# Patient Record
Sex: Male | Born: 1975 | Race: White | Hispanic: No | Marital: Married
Health system: Midwestern US, Community
[De-identification: ages and names within clinical notes are randomized; demographics above are authoritative.]

## PROBLEM LIST (undated history)

## (undated) DIAGNOSIS — M1732 Unilateral post-traumatic osteoarthritis, left knee: Secondary | ICD-10-CM

## (undated) DIAGNOSIS — Z96652 Presence of left artificial knee joint: Secondary | ICD-10-CM

## (undated) DIAGNOSIS — M1712 Unilateral primary osteoarthritis, left knee: Secondary | ICD-10-CM

## (undated) DIAGNOSIS — R739 Hyperglycemia, unspecified: Secondary | ICD-10-CM

## (undated) DIAGNOSIS — R5383 Other fatigue: Secondary | ICD-10-CM

## (undated) DIAGNOSIS — M25562 Pain in left knee: Secondary | ICD-10-CM

## (undated) DIAGNOSIS — E039 Hypothyroidism, unspecified: Secondary | ICD-10-CM

## (undated) DIAGNOSIS — Z09 Encounter for follow-up examination after completed treatment for conditions other than malignant neoplasm: Secondary | ICD-10-CM

## (undated) DIAGNOSIS — T1490XA Injury, unspecified, initial encounter: Secondary | ICD-10-CM

## (undated) DIAGNOSIS — G8929 Other chronic pain: Secondary | ICD-10-CM

---

## 2014-02-15 ENCOUNTER — Encounter: Admit: 2014-02-15

## 2014-02-15 ENCOUNTER — Ambulatory Visit: Admit: 2014-02-15

## 2014-03-03 ENCOUNTER — Inpatient Hospital Stay: Admit: 2014-03-03

## 2014-05-03 ENCOUNTER — Encounter

## 2014-06-21 ENCOUNTER — Encounter

## 2014-08-09 ENCOUNTER — Encounter

## 2016-10-02 ENCOUNTER — Encounter: Attending: Family Medicine | Primary: Family Medicine

## 2016-10-02 ENCOUNTER — Ambulatory Visit
Admit: 2016-10-02 | Discharge: 2016-10-02 | Payer: PRIVATE HEALTH INSURANCE | Attending: Family Medicine | Primary: Family Medicine

## 2016-10-02 DIAGNOSIS — Z0000 Encounter for general adult medical examination without abnormal findings: Secondary | ICD-10-CM

## 2016-10-02 MED ORDER — HYDROCODONE-ACETAMINOPHEN 5-325 MG PO TABS
5-325 MG | ORAL_TABLET | Freq: Four times a day (QID) | ORAL | 0 refills | Status: AC | PRN
Start: 2016-10-02 — End: 2016-10-09

## 2016-10-02 MED ORDER — DICLOFENAC SODIUM 50 MG PO TBEC
50 MG | ORAL_TABLET | Freq: Three times a day (TID) | ORAL | 3 refills | Status: DC
Start: 2016-10-02 — End: 2016-12-17

## 2016-10-02 NOTE — Assessment & Plan Note (Signed)
Labs as ordered

## 2016-10-02 NOTE — Assessment & Plan Note (Signed)
X 2 months, getting worse-get left knee xray, refer to Dr. Thana AtesNitz, diclofenac as needed, norco prn severe pain-reviewed risks/benefits/SE. Pt voices understanding

## 2016-10-02 NOTE — Patient Instructions (Addendum)
Patient Education        Joint Pain: Care Instructions  Your Care Instructions    Many people have small aches and pains from overuse or injury to muscles and joints. Joint injuries often happen during sports or recreation, work tasks, or projects around the home. An overuse injury can happen when you put too much stress on a joint or when you do an activity that stresses the joint over and over, such as using the computer or rowing a boat.  You can take action at home to help your muscles and joints get better. You should feel better in 1 to 2 weeks, but it can take 3 months or more to heal completely.  Follow-up care is a key part of your treatment and safety. Be sure to make and go to all appointments, and call your doctor if you are having problems. It's also a good idea to know your test results and keep a list of the medicines you take.  How can you care for yourself at home?   Do not put weight on the injured joint for at least a day or two.   For the first day or two after an injury, do not take hot showers or baths, and do not use hot packs. The heat could make swelling worse.   Put ice or a cold pack on the sore joint for 10 to 20 minutes at a time. Try to do this every 1 to 2 hours for the next 3 days (when you are awake) or until the swelling goes down. Put a thin cloth between the ice and your skin.   Wrap the injury in an elastic bandage. Do not wrap it too tightly because this can cause more swelling.   Prop up the sore joint on a pillow when you ice it or anytime you sit or lie down during the next 3 days. Try to keep it above the level of your heart. This will help reduce swelling.   Take an over-the-counter pain medicine, such as acetaminophen (Tylenol), ibuprofen (Advil, Motrin), or naproxen (Aleve). Read and follow all instructions on the label.   After 1 or 2 days of rest, begin moving the joint gently. While the joint is still healing, you can begin to exercise using activities that do  not strain or hurt the painful joint.  When should you call for help?  Call your doctor now or seek immediate medical care if:    You have signs of infection, such as:   Increased pain, swelling, warmth, and redness.   Red streaks leading from the joint.   A fever.   Watch closely for changes in your health, and be sure to contact your doctor if:    Your movement or symptoms are not getting better after 1 to 2 weeks of home treatment.   Where can you learn more?  Go to [email or web address] and sign in to your MyChart account. Enter P205 in the Search Health Information box to learn more about "Joint Pain: Care Instructions."     If you do not have an account, please click on the "Sign Up Now" link.  Current as of: March 07, 2016  Content Version: 11.6   2006-2018 [employer]. Care instructions adapted under license by [facility]. If you have questions about a medical condition or this instruction, always ask your healthcare professional. [employer] disclaims any warranty or liability for your use of this information.

## 2016-10-02 NOTE — Progress Notes (Signed)
Scott Jacobs  Apr 08, 1976  41 y.o.  male    SUBJECTIVE:    Chief Complaint   Patient presents with   . New Patient       HPI   Left knee pain-onset left knee pain/swelling approx 2 months ago. Pt was getting in truck and felt pop in knee-muscles "gave out". Continues to swell, throbs, nothing helps even when taking advil all day. Worse with wt bearing/bending. Pain mostly inner knee and below joint line. Pt has seen Dr. Thana Ates in past and would like to be referred back to him    Pt also here to establish care -chronic issues include hypothyroidism.  No current outpatient prescriptions on file prior to visit.     No current facility-administered medications on file prior to visit.      Review of PMH, PSH, Family Hx, Allergies and updates made as needed.      Allergies   Allergen Reactions   . Azithromycin Hives and Itching   . Clindamycin Hives and Itching       Past Medical History:   Diagnosis Date   . Hypothyroidism    . Knee pain        Past Surgical History:   Procedure Laterality Date   . ANTERIOR CRUCIATE LIGAMENT REPAIR Left     X2   . NASAL SEPTUM SURGERY     . TONSILLECTOMY  2017       Social History     Social History   . Marital status: Married     Spouse name: N/A   . Number of children: N/A   . Years of education: N/A     Social History Main Topics   . Smoking status: Former Smoker     Packs/day: 0.50     Years: 8.00     Types: Cigarettes     Quit date: 2007   . Smokeless tobacco: Former Neurosurgeon     Types: Snuff     Quit date: 71   . Alcohol use Yes      Comment: RARELY   . Drug use: No   . Sexual activity: Yes     Partners: Female     Other Topics Concern   . None     Social History Narrative   . None       Review of Systems   Constitutional: Negative for activity change, appetite change, chills, fatigue, fever and unexpected weight change.   HENT: Negative for dental problem, hearing loss and trouble swallowing.    Eyes: Negative for visual disturbance.   Respiratory: Negative for cough, chest  tightness and shortness of breath.    Cardiovascular: Negative for chest pain.   Gastrointestinal: Negative for abdominal pain, diarrhea, nausea and vomiting.   Endocrine: Negative for cold intolerance, heat intolerance, polydipsia and polyuria.   Genitourinary: Negative for dysuria, frequency and hematuria.   Musculoskeletal: Positive for arthralgias, gait problem and joint swelling. Negative for myalgias.   Skin: Negative for color change and rash.   Allergic/Immunologic: Negative for immunocompromised state.   Neurological: Negative for dizziness, weakness and headaches.   Hematological: Negative for adenopathy. Does not bruise/bleed easily.   Psychiatric/Behavioral: Negative for dysphoric mood. The patient is not nervous/anxious.        OBJECTIVE:    BP 136/84   Pulse 99   Resp 16   Ht 6' 3.75" (1.924 m)   Wt (!) 361 lb 3.2 oz (163.8 kg)   BMI 44.26 kg/m  Physical Exam   Constitutional: He is oriented to person, place, and time. He appears well-developed and well-nourished. He is cooperative. No distress.   HENT:   Head: Normocephalic.   Right Ear: Tympanic membrane, external ear and ear canal normal.   Left Ear: Tympanic membrane, external ear and ear canal normal.   Nose: Nose normal.   Mouth/Throat: Uvula is midline, oropharynx is clear and moist and mucous membranes are normal.   Neck: Trachea normal and normal range of motion. Neck supple. No thyromegaly present.   Cardiovascular: Normal rate, regular rhythm and intact distal pulses.    Pulmonary/Chest: Effort normal and breath sounds normal.   Abdominal: Soft. Normal appearance and bowel sounds are normal. There is no hepatosplenomegaly. There is no tenderness.   Musculoskeletal: Normal range of motion.        Left knee: He exhibits swelling. Tenderness found. Medial joint line tenderness noted.   Lymphadenopathy:     He has no cervical adenopathy.   Neurological: He is alert and oriented to person, place, and time. He has normal strength. No  cranial nerve deficit or sensory deficit.   Skin: Skin is warm, dry and intact. No rash noted.   Psychiatric: He has a normal mood and affect. His speech is normal and behavior is normal. Judgment and thought content normal. Cognition and memory are normal.       ASSESSMENT/PLAN:    Problem List        Endocrine    Acquired hypothyroidism     Labs as ordered         Relevant Medications    levothyroxine (SYNTHROID) 200 MCG tablet    Other Relevant Orders    TSH WITH REFLEX TO FT4       Other    Acute pain of left knee     X 2 months, getting worse-get left knee xray, refer to Dr. Thana AtesNitz, diclofenac as needed, norco prn severe pain-reviewed risks/benefits/SE. Pt voices understanding         Relevant Medications    diclofenac (VOLTAREN) 50 MG EC tablet    HYDROcodone-acetaminophen (NORCO) 5-325 MG per tablet    Other Relevant Orders    XR KNEE LEFT (1-2 VIEWS)    External Referral To Orthopedic Surgery    Well adult exam - Primary    Relevant Orders    Comprehensive Metabolic Panel    Screening for hyperlipidemia    Relevant Orders    Lipid Panel               Return if symptoms worsen or fail to improve.

## 2016-11-08 MED ORDER — LEVOTHYROXINE SODIUM 200 MCG PO TABS
200 | ORAL_TABLET | Freq: Every day | ORAL | 3 refills | Status: DC
Start: 2016-11-08 — End: 2017-11-10

## 2016-12-17 MED ORDER — MELOXICAM 15 MG PO TABS
15 MG | ORAL_TABLET | Freq: Every day | ORAL | 3 refills | Status: DC
Start: 2016-12-17 — End: 2017-02-05

## 2016-12-17 NOTE — Telephone Encounter (Signed)
Let Amy know the order for the left knee xray is still good and can be done at the imaging center on [address] [facility]. He can go ahead and do that if he'd like. I thought he was going to see ortho-did he see someone? Does he want to see someone? He may need injections for pain relief. I can send in something else for pain, have him stop the diclofenac, but if he needs anything stronger then he would need to have an office visit.

## 2016-12-17 NOTE — Telephone Encounter (Signed)
I advised Scott Jacobs that Mobic was sent to the pharmacy for [name] and to stop the diclofenac. Scott Jacobs stated she would get pt scheduled to get the x-ray at the imaging center in [place] soon. She states pt has not seen Ortho yet, he has an open referral to Dr. [place]itz in [place] and states she thought they were waiting on the x-ray before they scheduled an appt with Dr. Thana AtesNitz.

## 2016-12-17 NOTE — Telephone Encounter (Signed)
I received a VM on MA line from Amy, pt's wife stating that pt has not gone to get x-ray for knee yet, he wasn't sure where to go to get that. Amy states that Scott Jacobs has had a lot of knee pain and wanted to know if any medication could be prescribed for hat or if he needed another appt to be seen. Please advise.

## 2016-12-20 ENCOUNTER — Ambulatory Visit: Admit: 2016-12-20 | Primary: Family Medicine

## 2016-12-20 ENCOUNTER — Inpatient Hospital Stay: Attending: Family Medicine | Primary: Family Medicine

## 2016-12-20 DIAGNOSIS — M25562 Pain in left knee: Secondary | ICD-10-CM

## 2017-02-05 ENCOUNTER — Ambulatory Visit
Admit: 2017-02-05 | Discharge: 2017-02-05 | Payer: PRIVATE HEALTH INSURANCE | Attending: Family Medicine | Primary: Family Medicine

## 2017-02-05 DIAGNOSIS — M25562 Pain in left knee: Secondary | ICD-10-CM

## 2017-02-05 MED ORDER — HYDROCODONE-ACETAMINOPHEN 5-325 MG PO TABS
5-325 MG | ORAL_TABLET | Freq: Four times a day (QID) | ORAL | 0 refills | Status: AC | PRN
Start: 2017-02-05 — End: 2017-02-12

## 2017-02-05 MED ORDER — MELOXICAM 15 MG PO TABS
15 | ORAL_TABLET | Freq: Every day | ORAL | 3 refills | Status: DC
Start: 2017-02-05 — End: 2017-05-16

## 2017-02-05 NOTE — Progress Notes (Signed)
Scott Jacobs  12-20-1975  41 y.o.  male    SUBJECTIVE:    Chief Complaint   Patient presents with   . Discuss Medications   . Flu Vaccine     pt declines flu shot today       HPI   Chronic left knee pain-x several months, has had xray but not seen ortho yet. meloxicam helps greatly. 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 Working on diet/trying to exercise for wt loss to see it this will help with pain also.    Current Outpatient Prescriptions on File Prior to Visit   Medication Sig Dispense Refill   . levothyroxine (SYNTHROID) 200 MCG tablet Take 1 tablet by mouth Daily 90 tablet 3     No current facility-administered medications on file prior to visit.      Review of PMH, PSH, Family Hx, Allergies and updates made as needed.      Allergies   Allergen Reactions   . Azithromycin Hives and Itching   . Clindamycin Hives and Itching       Past Medical History:   Diagnosis Date   . Hypothyroidism    . Knee pain        Past Surgical History:   Procedure Laterality Date   . ANTERIOR CRUCIATE LIGAMENT REPAIR Left     X2   . NASAL SEPTUM SURGERY     . TONSILLECTOMY  2017       Social History     Social History   . Marital status: Married     Spouse name: N/A   . Number of children: N/A   . Years of education: N/A     Social History Main Topics   . Smoking status: Former Smoker     Packs/day: 0.50     Years: 8.00     Types: Cigarettes     Quit date: 2007   . Smokeless tobacco: Former Neurosurgeon     Types: Snuff     Quit date: 73   . Alcohol use Yes      Comment: RARELY   . Drug use: No   . Sexual activity: Yes     Partners: Female     Other Topics Concern   . None     Social History Narrative   . None       Review of Systems   Constitutional: Negative for fatigue.   Respiratory: Negative for cough and shortness of breath.    Cardiovascular: Negative for chest pain and leg swelling.   Endocrine: Negative.    Musculoskeletal: Positive for  arthralgias.   Skin: Negative for rash.   Neurological: Negative for dizziness and headaches.       OBJECTIVE:    BP 138/88 (Site: Left Upper Arm, Position: Sitting, Cuff Size: Large Adult)   Pulse 91   Resp 20   Ht 6\' 1"  (1.854 m)   Wt (!) 358 lb 12.8 oz (162.8 kg)   BMI 47.34 kg/m     Physical Exam   Constitutional: He is oriented to person, place, and time. He appears well-developed and well-nourished. No distress.   HENT:   Head: Normocephalic.   Right Ear: External ear normal.   Left Ear: External ear normal.   Nose: Nose normal.   Neck: Neck supple. No thyromegaly present.   Cardiovascular: Normal rate, regular rhythm and normal heart sounds.    Pulmonary/Chest:  Effort normal and breath sounds normal.   Musculoskeletal:        Left knee: Tenderness found.   Neurological: He is alert and oriented to person, place, and time.   Skin: Skin is warm.       ASSESSMENT/PLAN:    Problem List        Other    Chronic pain of left knee - Primary     Doing well with mobic, reviewed xray results with pt, pt would like to wait until after shutdown in December to see ortho  Refill norco-pt using only for severe pain, usually 1-2 times a month-reviewed risks/benefits/Se with pt-voices understanding         Relevant Medications    meloxicam (MOBIC) 15 MG tablet    HYDROcodone-acetaminophen (NORCO) 5-325 MG per tablet    Class 3 severe obesity without serious comorbidity with body mass index (BMI) of 45.0 to 49.9 in adult [facility])     Will continue to work on healthy diet choices, increase exercise               Attestation: The Prescription Monitoring Report for this patient was reviewed today. Alycia Patten(Janina Trafton, PA-C)  Documentation: No signs of potential drug abuse or diversion identified., Obtaining appropriate analgesic effect of treatment., Possible medication side effects, risk of tolerance/dependence & alternative treatments discussed. Alycia Patten(Dailey Alberson, PA-C)    Return if symptoms worsen or fail to improve.

## 2017-02-05 NOTE — Assessment & Plan Note (Signed)
Doing well with mobic, reviewed xray results with pt, pt would like to wait until after shutdown in December to see ortho  Refill norco-pt using only for severe pain, usually 1-2 times a month-reviewed risks/benefits/Se with pt-voices understanding

## 2017-02-05 NOTE — Assessment & Plan Note (Signed)
Will continue to work on [employer] diet choices, increase exercise

## 2017-02-14 NOTE — Telephone Encounter (Signed)
Cld & left vm advising pt [facility] will not release lab results without a release of auth form signed by him. I advised him to call [facility] and get the results either mailed to him or our office. And to provide us with a copy and we can scan into his record.

## 2017-05-16 ENCOUNTER — Encounter

## 2017-05-16 ENCOUNTER — Ambulatory Visit
Admit: 2017-05-16 | Discharge: 2017-05-16 | Payer: PRIVATE HEALTH INSURANCE | Attending: Family Medicine | Primary: Family Medicine

## 2017-05-16 DIAGNOSIS — M25562 Pain in left knee: Secondary | ICD-10-CM

## 2017-05-16 MED ORDER — HYDROCODONE-ACETAMINOPHEN 5-325 MG PO TABS
5-325 MG | ORAL_TABLET | Freq: Four times a day (QID) | ORAL | 0 refills | Status: AC | PRN
Start: 2017-05-16 — End: 2017-05-23

## 2017-05-16 NOTE — Assessment & Plan Note (Signed)
Lab order as directed

## 2017-05-16 NOTE — Assessment & Plan Note (Signed)
Pt has lost 20 # in last 6-8 weeks with calorie restriction, eating more fresh fruits/veggies/healthier food choices  Will work on increasing activity levels

## 2017-05-16 NOTE — Progress Notes (Signed)
Scott Jacobs  06/22/1975  42 y.o.  male    SUBJECTIVE:    Chief Complaint   Patient presents with   . Knee Pain     left knee ongoing pain; not getting better       HPI  Left knee pain-chronic, ongoing. Worse with wt bearing/end of long day of standing at work. Has seen ortho in past for same but it has been years ago. Has used multiple OTC meds with little relief, norco does help at times. Pt working on wt loss-has restricted calories to 1700-1900 and has lost 20# in last 6-8 weeks. Has not noticed yet that the weight loss is helping his knee pain.    Hypothyroidism-chronic, since losing weight in last two months he is feeling more jittery. Feels similar to past when medication was too high.    Screening labs-needs for wellness visit coming up, would like to do ahead of time to review.     Current Outpatient Prescriptions on File Prior to Visit   Medication Sig Dispense Refill   . levothyroxine (SYNTHROID) 200 MCG tablet Take 1 tablet by mouth Daily 90 tablet 3     No current facility-administered medications on file prior to visit.      Review of PMH, PSH, Family Hx, Allergies and updates made as needed.    PHQ-9 Total Score: 0 (05/16/2017  3:54 PM)      Allergies   Allergen Reactions   . Azithromycin Hives and Itching   . Clindamycin Hives and Itching       Past Medical History:   Diagnosis Date   . Hypothyroidism    . Knee pain        Past Surgical History:   Procedure Laterality Date   . ANTERIOR CRUCIATE LIGAMENT REPAIR Left     X2   . NASAL SEPTUM SURGERY     . TONSILLECTOMY  2017       Social History     Social History   . Marital status: Married     Spouse name: N/A   . Number of children: N/A   . Years of education: N/A     Social History Main Topics   . Smoking status: Former Smoker     Packs/day: 0.50     Years: 8.00     Types: Cigarettes     Quit date: 2007   . Smokeless tobacco: Former NeurosurgeonUser     Types: Snuff     Quit date: 411997   . Alcohol use Yes      Comment: RARELY   . Drug use: No   . Sexual  activity: Yes     Partners: Female     Other Topics Concern   . None     Social History Narrative   . None       Review of Systems   Constitutional: Negative for activity change, appetite change, fatigue and unexpected weight change.   Respiratory: Negative for cough and shortness of breath.    Cardiovascular: Negative for chest pain and leg swelling.   Endocrine: Negative.    Musculoskeletal: Positive for arthralgias and joint swelling.   Skin: Negative for rash.   Neurological: Negative for dizziness, light-headedness and headaches.   Hematological: Negative for adenopathy.       OBJECTIVE:    BP 124/78 (Site: Left Upper Arm, Position: Sitting, Cuff Size: Large Adult)   Pulse 86   Resp 18   Ht 6\' 1"  (1.854 m)  Wt (!) 345 lb 6.4 oz (156.7 kg)   BMI 45.57 kg/m     Physical Exam   Constitutional: He is oriented to person, place, and time. He appears well-developed and well-nourished. No distress.   Neck: Normal range of motion. Neck supple. No thyromegaly present.   Cardiovascular: Normal rate, regular rhythm and normal heart sounds.    Pulmonary/Chest: Effort normal.   Musculoskeletal:        Left knee: He exhibits effusion and bony tenderness. Tenderness found. Medial joint line and lateral joint line tenderness noted.   Neurological: He is alert and oriented to person, place, and time.       ASSESSMENT/PLAN:    Problem List        Endocrine    Acquired hypothyroidism     Labs ordered today         Relevant Medications    levothyroxine (SYNTHROID) 200 MCG tablet    Other Relevant Orders    TSH WITH REFLEX TO FT4    Comprehensive Metabolic Panel       Other    Chronic pain of left knee - Primary     Will get MRI now, refill norco to use prn-Risks/benefits/SE reviewed, pt voices understanding  Refer to ortho once MRI completed         Relevant Medications    HYDROcodone-acetaminophen (NORCO) 5-325 MG per tablet    Other Relevant Orders    MRI KNEE LEFT W CONTRAST    Class 3 severe obesity without serious  comorbidity with body mass index (BMI) of 45.0 to 49.9 in adult (HCC)     Pt has lost 20 # in last 6-8 weeks with calorie restriction, eating more fresh fruits/veggies/healthier food choices  Will work on increasing activity levels         Screening for cholesterol level     Lab order as directed         Relevant Orders    Comprehensive Metabolic Panel    Lipid, Fasting               Return if symptoms worsen or fail to improve.

## 2017-05-16 NOTE — Assessment & Plan Note (Signed)
Labs ordered today

## 2017-05-16 NOTE — Assessment & Plan Note (Signed)
Will get MRI now, refill norco to use prn-Risks/benefits/SE reviewed, pt voices understanding  Refer to ortho once MRI completed

## 2017-05-17 LAB — COMPREHENSIVE METABOLIC PANEL
ALT: 42 U/L — ABNORMAL HIGH (ref 10–40)
AST: 29 U/L (ref 15–37)
Albumin/Globulin Ratio: 1.8 (ref 1.1–2.2)
Albumin: 4.9 g/dL (ref 3.4–5.0)
Alkaline Phosphatase: 110 U/L (ref 40–129)
Anion Gap: 15 (ref 3–16)
BUN: 13 mg/dL (ref 7–20)
CO2: 25 mmol/L (ref 21–32)
Calcium: 9.6 mg/dL (ref 8.3–10.6)
Chloride: 100 mmol/L (ref 99–110)
Creatinine: 0.8 mg/dL — ABNORMAL LOW (ref 0.9–1.3)
GFR African American: >60 (ref >60)
GFR Non-African American: >60 (ref >60)
Globulin: 2.8 g/dL
Glucose: 86 mg/dL (ref 70–99)
Potassium: 3.8 mmol/L (ref 3.5–5.1)
Sodium: 140 mmol/L (ref 136–145)
Total Bilirubin: 0.5 mg/dL (ref 0.0–1.0)
Total Protein: 7.7 g/dL (ref 6.4–8.2)

## 2017-05-17 LAB — TSH WITH REFLEX TO FT4: TSH Reflex FT4: 1.46 u[IU]/mL (ref 0.27–4.20)

## 2017-05-17 LAB — LIPID, FASTING
Cholesterol, Fasting: 154 mg/dL (ref 0–199)
HDL: 36 mg/dL — ABNORMAL LOW (ref 40–60)
LDL Calculated: 84 mg/dL (ref <100)
Triglyceride, Fasting: 171 mg/dL — ABNORMAL HIGH (ref 0–150)
VLDL Cholesterol Calculated: 34 mg/dL

## 2017-05-22 ENCOUNTER — Telehealth

## 2017-05-22 NOTE — Telephone Encounter (Signed)
Scott Jacobs cld and left vm stating that Per their Radiologist, MRI are done Without Contrast.  [facility] K[facility] With Contrast, They would like the order redone to state MRI LT Knee Without Contrast.

## 2017-05-22 NOTE — Telephone Encounter (Signed)
Order changed and put in

## 2017-05-25 ENCOUNTER — Inpatient Hospital Stay: Admit: 2017-05-25 | Payer: PRIVATE HEALTH INSURANCE | Primary: Family Medicine

## 2017-05-25 DIAGNOSIS — M25562 Pain in left knee: Secondary | ICD-10-CM

## 2017-07-24 NOTE — Telephone Encounter (Signed)
Pt's wife Amy called, stating MRI of knee was not covered by insurance, and that a PA should have been done prior.     Insurance told her that pt should have had XR and had RX for NSAIDS, prior to MRI.     XR of knee, Meloxicam Rx and OV from 10/02/16 (discussing knee and Rx), faxed to [facility] at [phone].Pending Auth [id number]. Confirmed fax scanned to chart.     No further action is needed, at this time.

## 2017-08-27 ENCOUNTER — Ambulatory Visit
Admit: 2017-08-27 | Discharge: 2017-08-27 | Payer: PRIVATE HEALTH INSURANCE | Attending: Family Medicine | Primary: Family Medicine

## 2017-08-27 DIAGNOSIS — M25562 Pain in left knee: Secondary | ICD-10-CM

## 2017-08-27 MED ORDER — MELOXICAM 15 MG PO TABS
15 MG | ORAL_TABLET | Freq: Every day | ORAL | 1 refills | Status: DC | PRN
Start: 2017-08-27 — End: 2017-12-05

## 2017-08-27 MED ORDER — HYDROCODONE-ACETAMINOPHEN 5-325 MG PO TABS
5-325 MG | ORAL_TABLET | Freq: Four times a day (QID) | ORAL | 0 refills | Status: AC | PRN
Start: 2017-08-27 — End: 2017-09-26

## 2017-08-27 NOTE — Progress Notes (Signed)
Scott Jacobs  08-13-1975  42 y.o.  male    SUBJECTIVE:    Chief Complaint   Patient presents with   ??? Knee Pain     still having issues with left knee       HPI   Chronic left knee pain-has had ACL repair in past, in past year pt has had increasing pain-worse with standing/wt bearing at times. 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 Pt had been using mobic with moderate relief. Norco prn severe pain with good results. Had MRI 05/25/17:    . Status post ACL reconstruction with complete rupture of the graft.   2. Complex tearing/maceration of the posterior horn of the medial meniscus to   the extent the meniscus is visualized on the current exam.   3. Free edge degeneration of the lateral meniscus without definite discrete   tear to the extent the meniscus is visualized on the current exam.   4. Mild-to-moderate tricompartmental osteoarthritis with underlying grade 4   chondromalacia involving all 3 compartments of the knee.   5. Small knee effusion and synovitis.   6. Small popliteal cyst.   7. ??Heterogeneous appearance of the bone marrow which is a nonspecific   finding and can be seen in obese patient, smokers, and in the setting of   anemia amongst other etiologies. ??Clinical and laboratory correlation   suggested.     Pt will be seeing previous ortho but is having trouble getting in with his current work hours.    Current Outpatient Medications on File Prior to Visit   Medication Sig Dispense Refill   ??? levothyroxine (SYNTHROID) 200 MCG tablet Take 1 tablet by mouth Daily 90 tablet 3     No current facility-administered medications on file prior to visit.      Review of PMH, PSH, Family Hx, Allergies and updates made as needed.    /PHQ-9 Total Score: 0 (08/27/2017  4:11 PM)      Allergies   Allergen Reactions   ??? Azithromycin Hives and Itching   ??? Clindamycin Hives and Itching       Past Medical History:   Diagnosis Date   ??? Hypothyroidism    ??? Knee pain        Past Surgical History:    Procedure Laterality Date   ??? ANTERIOR CRUCIATE LIGAMENT REPAIR Left     X2   ??? NASAL SEPTUM SURGERY     ??? TONSILLECTOMY  2017       Social History     Socioeconomic History   ??? Marital status: Married     Spouse name: None   ??? Number of children: None   ??? Years of education: None   ??? Highest education level: None   Occupational History   ??? None   Social Needs   ??? Financial resource strain: None   ??? Food insecurity:     Worry: None     Inability: None   ??? Transportation needs:     Medical: None     Non-medical: None   Tobacco Use   ??? Smoking status: Former Smoker     Packs/day: 0.50     Years: 8.00     Pack years: 4.00     Types: Cigarettes     Last attempt to quit: 2007     Years since quitting: 12.3   ??? Smokeless tobacco: Former Neurosurgeon     Types: Snuff  Quit date: 1997   Substance and Sexual Activity   ??? Alcohol use: Yes     Comment: RARELY   ??? Drug use: No   ??? Sexual activity: Yes     Partners: Female   Lifestyle   ??? Physical activity:     Days per week: None     Minutes per session: None   ??? Stress: None   Relationships   ??? Social connections:     Talks on phone: None     Gets together: None     Attends religious service: None     Active member of club or organization: None     Attends meetings of clubs or organizations: None     Relationship status: None   ??? Intimate partner violence:     Fear of current or ex partner: None     Emotionally abused: None     Physically abused: None     Forced sexual activity: None   Other Topics Concern   ??? None   Social History Narrative   ??? None       Review of Systems   Constitutional: Negative for chills, fever and unexpected weight change.   Respiratory: Negative for cough and shortness of breath.    Cardiovascular: Negative for chest pain and leg swelling.   Musculoskeletal: Positive for arthralgias.   Skin: Negative for color change and rash.   Neurological: Negative for light-headedness and headaches.       OBJECTIVE:    BP 136/86 (Site: Left Upper Arm, Position:  Sitting, Cuff Size: Large Adult)    Pulse 65    Resp 18    Ht  (1.854 m)    Wt (!) 329 lb 9.6 oz (149.5 kg)    BMI 43.49 kg/m??     Physical Exam   Constitutional: He is oriented to person, place, and time. He appears well-developed and well-nourished. No distress.   HENT:   Head: Normocephalic.   Right Ear: External ear normal.   Left Ear: External ear normal.   Nose: Nose normal.   Eyes: Conjunctivae and EOM are normal.   Neck: Normal range of motion. Neck supple.   Cardiovascular: Normal rate, regular rhythm and normal heart sounds.   Pulmonary/Chest: Effort normal and breath sounds normal.   Musculoskeletal: He exhibits tenderness.        Left knee: He exhibits decreased range of motion. Tenderness found.   Neurological: He is alert and oriented to person, place, and time.   Skin: Skin is warm and dry.       ASSESSMENT/PLAN:    Problem List        Musculoskeletal and Integument    Complex tear of medial meniscus of left knee as current injury    Relevant Medications    HYDROcodone-acetaminophen (NORCO) 5-325 MG per tablet    Tricompartment osteoarthritis of left knee    Relevant Medications    meloxicam (MOBIC) 15 MG tablet    HYDROcodone-acetaminophen (NORCO) 5-325 MG per tablet    ACL graft tear (HCC)    Relevant Medications    HYDROcodone-acetaminophen (NORCO) 5-325 MG per tablet       Other    Chronic pain of left knee - Primary     Will refill mobic and norco-Risks/benefits/SE reviewed, pt voices understanding  Pt will f/u with ortho in near future-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  Relevant Medications    meloxicam (MOBIC) 15 MG tablet    HYDROcodone-acetaminophen (NORCO) 5-325 MG per tablet          Attestation: The Prescription Monitoring Report for this patient was reviewed today. Alycia Patten, PA-C)  Chronic Pain Routine Monitoring: Possible medication side effects, risk of tolerance/dependence & alternative treatments discussed., Obtaining  appropriate analgesic effect of treatment., No signs of potential drug abuse or diversion identified: otherwise, see note documentation Alycia Patten, PA-C)    Return if symptoms worsen or fail to improve.

## 2017-08-28 NOTE — Assessment & Plan Note (Signed)
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

## 2017-08-29 NOTE — Telephone Encounter (Signed)
I will contact pt and see if he wants to just pay cash or have me change to 7 day script

## 2017-08-29 NOTE — Telephone Encounter (Signed)
April from [facility] states they sent over a request on Tuesday for  Prior Authorization for patients Norco 5/325. Insurance will only pay up to a 7 day supply without a PA. Please call PA ph# [phone]

## 2017-10-18 ENCOUNTER — Ambulatory Visit
Admit: 2017-10-18 | Discharge: 2017-10-18 | Payer: PRIVATE HEALTH INSURANCE | Attending: Family Medicine | Primary: Family Medicine

## 2017-10-18 DIAGNOSIS — Z0000 Encounter for general adult medical examination without abnormal findings: Secondary | ICD-10-CM

## 2017-10-18 MED ORDER — HYDROCODONE-ACETAMINOPHEN 5-325 MG PO TABS
5-325 MG | ORAL_TABLET | Freq: Four times a day (QID) | ORAL | 0 refills | Status: AC | PRN
Start: 2017-10-18 — End: 2017-11-17

## 2017-10-18 NOTE — Assessment & Plan Note (Signed)
Labs as ordered today, recheck prn

## 2017-10-18 NOTE — Progress Notes (Signed)
10/18/2017    Scott Jacobs (DOB:  1975-09-18) is a 42 y.o. male, here for a preventive medicine evaluation.    Patient Active Problem List   Diagnosis   ??? Chronic pain of left knee   ??? Well adult exam   ??? Acquired hypothyroidism   ??? Class 3 severe obesity without serious comorbidity with body mass index (BMI) of 45.0 to 49.9 in adult [facility](HCC)   ??? Complex tear of medial meniscus of left knee as current injury   ??? Tricompartment osteoarthritis of left knee   ??? ACL graft tear (HCC)       Review of Systems   Constitutional: Negative for activity change, appetite change, chills, fatigue, fever and unexpected weight change.   HENT: Negative for dental problem, hearing loss and trouble swallowing.    Eyes: Negative for visual disturbance.   Respiratory: Negative for cough, chest tightness and shortness of breath.    Cardiovascular: Negative for chest pain.   Gastrointestinal: Negative for abdominal pain, diarrhea, nausea and vomiting.   Endocrine: Negative for cold intolerance, heat intolerance, polydipsia, polyphagia and polyuria.   Genitourinary: Negative for dysuria, frequency and hematuria.   Musculoskeletal: Positive for arthralgias and joint swelling. Negative for myalgias.   Skin: Negative for color change and rash.   Allergic/Immunologic: Negative for immunocompromised state.   Neurological: Negative for dizziness, weakness and headaches.   Hematological: Negative for adenopathy. Does not bruise/bleed easily.   Psychiatric/Behavioral: Positive for sleep disturbance. Negative for dysphoric mood. The patient is not nervous/anxious.        Prior to Visit Medications    Medication Sig Taking? Authorizing Provider   HYDROcodone-acetaminophen (NORCO) 5-325 MG per tablet Take 1 tablet by mouth every 6 hours as needed for Pain for up to 30 days. Intended supply: 30 days Yes Alycia PattenJill Vere Diantonio, PA-C   meloxicam (MOBIC) 15 MG tablet Take 1 tablet by mouth daily as needed for Pain Yes Alycia PattenJill Neiko Trivedi, PA-C   levothyroxine  (SYNTHROID) 200 MCG tablet Take 1 tablet by mouth Daily Yes Alycia PattenJill Millisa Giarrusso, PA-C        Allergies   Allergen Reactions   ??? Azithromycin Hives and Itching   ??? Clindamycin Hives and Itching       Past Medical History:   Diagnosis Date   ??? Hypothyroidism    ??? Knee pain        Past Surgical History:   Procedure Laterality Date   ??? ANTERIOR CRUCIATE LIGAMENT REPAIR Left     X2   ??? NASAL SEPTUM SURGERY     ??? TONSILLECTOMY  2017       Social History     Socioeconomic History   ??? Marital status: Married     Spouse name: Not on file   ??? Number of children: Not on file   ??? Years of education: Not on file   ??? Highest education level: Not on file   Occupational History   ??? Not on file   Social Needs   ??? Financial resource strain: Not on file   ??? Food insecurity:     Worry: Not on file     Inability: Not on file   ??? Transportation needs:     Medical: Not on file     Non-medical: Not on file   Tobacco Use   ??? Smoking status: Former Smoker     Packs/day: 0.50     Years: 8.00     Pack years: 4.00     Types:  Cigarettes     Last attempt to quit: 2007     Years since quitting: 12.5   ??? Smokeless tobacco: Former Neurosurgeon     Types: Snuff     Quit date: 1997   Substance and Sexual Activity   ??? Alcohol use: Yes     Comment: RARELY   ??? Drug use: No   ??? Sexual activity: Yes     Partners: Female   Lifestyle   ??? Physical activity:     Days per week: Not on file     Minutes per session: Not on file   ??? Stress: Not on file   Relationships   ??? Social connections:     Talks on phone: Not on file     Gets together: Not on file     Attends religious service: Not on file     Active member of club or organization: Not on file     Attends meetings of clubs or organizations: Not on file     Relationship status: Not on file   ??? Intimate partner violence:     Fear of current or ex partner: Not on file     Emotionally abused: Not on file     Physically abused: Not on file     Forced sexual activity: Not on file   Other Topics Concern   ??? Not on file   Social  History Narrative   ??? Not on file        Family History   Problem Relation Age of Onset   ??? Cancer Mother    ??? Cancer Father    ??? Cancer Maternal Grandmother    ??? Cancer Maternal Grandfather    ??? Alzheimer's Disease Paternal Grandmother    ??? Cancer Paternal Grandfather        ADVANCE DIRECTIVE: N, Not Received    Vitals:    10/18/17 1311   BP: 130/84   Site: Left Upper Arm   Position: Sitting   Cuff Size: Large Adult   Pulse: 80   Resp: 16   Weight: (!) 335 lb 4 oz (152.1 kg)   Height: 6\' 1"  (1.854 m)     Estimated body mass index is 44.23 kg/m?? as calculated from the following:    Height as of this encounter: 6\' 1"  (1.854 m).    Weight as of this encounter: 335 lb 4 oz (152.1 kg).    Physical Exam   Constitutional: He is oriented to person, place, and time. He appears well-developed and well-nourished. He is cooperative. No distress.   HENT:   Head: Normocephalic.   Right Ear: Tympanic membrane, external ear and ear canal normal.   Left Ear: Tympanic membrane, external ear and ear canal normal.   Nose: Nose normal.   Mouth/Throat: Uvula is midline, oropharynx is clear and moist and mucous membranes are normal.   Neck: Trachea normal and normal range of motion. Neck supple. No thyromegaly present.   Cardiovascular: Normal rate, regular rhythm and intact distal pulses.   Pulmonary/Chest: Effort normal and breath sounds normal.   Abdominal: Soft. Normal appearance and bowel sounds are normal. There is no hepatosplenomegaly. There is no tenderness.   Musculoskeletal: Normal range of motion.        Left knee: He exhibits bony tenderness and abnormal meniscus. Tenderness found. Medial joint line and lateral joint line tenderness noted.   Lymphadenopathy:     He has no cervical adenopathy.   Neurological: He is alert and  oriented to person, place, and time. He has normal strength.   Skin: Skin is warm, dry and intact. No rash noted.   Psychiatric: He has a normal mood and affect. His speech is normal and behavior is  normal. Judgment and thought content normal. Cognition and memory are normal.       No flowsheet data found.    Lab Results   Component Value Date    CHOLFAST 154 05/16/2017    TRIGLYCFAST 171 05/16/2017    HDL 36 05/16/2017    LDLCALC 84 05/16/2017    GLUCOSE 86 05/16/2017       The 10-year ASCVD risk score Denman George DC Jr., et al., 2013) is: 1.2%    Values used to calculate the score:      Age: 40 years      Sex: Male      Is Non-Hispanic African American: No      Diabetic: No      Tobacco smoker: No      Systolic Blood Pressure: 130 mmHg      Is BP treated: No      HDL Cholesterol: 36 mg/dL      Total Cholesterol: 154 mg/dL    Immunization History   Administered Date(s) Administered   ??? Tdap (Boostrix, Adacel) 04/09/2013       Health Maintenance   Topic Date Due   ??? HIV screen  03/08/1991   ??? Flu vaccine (1) 12/08/2017   ??? TSH testing  05/16/2018   ??? Lipid screen  05/16/2022   ??? DTaP/Tdap/Td vaccine (2 - Td) 04/10/2023   ??? Pneumococcal 0-64 years Vaccine  Aged Out       ASSESSMENT/PLAN:  1. Well adult exam    - TSH WITH REFLEX TO FT4; Future  - Lipid, Fasting; Future  - Comprehensive Metabolic Panel; Future    2. Acquired hypothyroidism      3. Tricompartment osteoarthritis of left knee    - HYDROcodone-acetaminophen (NORCO) 5-325 MG per tablet; Take 1 tablet by mouth every 6 hours as needed for Pain for up to 30 days. Intended supply: 30 days  Dispense: 120 tablet; Refill: 0    4. Complex tear of medial meniscus of left knee as current injury, subsequent encounter    - HYDROcodone-acetaminophen (NORCO) 5-325 MG per tablet; Take 1 tablet by mouth every 6 hours as needed for Pain for up to 30 days. Intended supply: 30 days  Dispense: 120 tablet; Refill: 0      Return if symptoms worsen or fail to improve.    An electronic signature was used to authenticate this note.    --Alycia Patten, PA-C on 10/18/2017 at 1:52 PM

## 2017-10-18 NOTE — Assessment & Plan Note (Signed)
Labs as ordered

## 2017-10-18 NOTE — Assessment & Plan Note (Signed)
F/u with Dr Thana AtesNitz as scheduled, refill norco to use prn, Risks/benefits/SE reviewed, pt voices understanding

## 2017-11-11 MED ORDER — LEVOTHYROXINE SODIUM 200 MCG PO TABS
200 MCG | ORAL_TABLET | ORAL | 3 refills | Status: DC
Start: 2017-11-11 — End: 2018-06-20

## 2017-12-05 ENCOUNTER — Ambulatory Visit
Admit: 2017-12-05 | Discharge: 2017-12-05 | Payer: PRIVATE HEALTH INSURANCE | Attending: Family Medicine | Primary: Family Medicine

## 2017-12-05 DIAGNOSIS — M1712 Unilateral primary osteoarthritis, left knee: Secondary | ICD-10-CM

## 2017-12-05 MED ORDER — LORCASERIN HCL 10 MG PO TABS
10 | ORAL_TABLET | Freq: Every day | ORAL | 0 refills | Status: AC
Start: 2017-12-05 — End: 2018-01-04

## 2017-12-05 MED ORDER — HYDROCODONE-ACETAMINOPHEN 5-325 MG PO TABS
5-325 | ORAL_TABLET | Freq: Four times a day (QID) | ORAL | 0 refills | Status: AC | PRN
Start: 2017-12-05 — End: 2018-01-04

## 2017-12-05 NOTE — Progress Notes (Signed)
Scott Jacobs  [birth year]  42 y.o.  male    SUBJECTIVE:    Chief Complaint   Patient presents with   . Knee Pain     pt has had no improvement with pain in left knee. He stated he is going to be getting surgery, but he is trying to wait until December. He stated that medication is helping.        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 Pain is severe on most days, able to use advil/icy hot with some relief. Norco helps and uses mostly in evening/night after work. Sees ortho in two months    Obesity-chronic, has done recently with diet but unable to exercise much due to knee. Would like to try something to help with wt loss.    Current Outpatient Medications on File Prior to Visit   Medication Sig Dispense Refill   . levothyroxine (SYNTHROID) 200 MCG tablet TAKE 1 TABLET BY MOUTH EVERY DAY 90 tablet 3     No current facility-administered medications on file prior to visit.      Review of PMH, PSH, Family Hx, Allergies and updates made as needed.    PHQ-9 Total Score: 0 (12/05/2017  4:10 PM)      Allergies   Allergen Reactions   . Azithromycin Hives and Itching   . Clindamycin Hives and Itching       Past Medical History:   Diagnosis Date   . Hypothyroidism    . Knee pain        Past Surgical History:   Procedure Laterality Date   . ANTERIOR CRUCIATE LIGAMENT REPAIR Left     X2   . NASAL SEPTUM SURGERY     . TONSILLECTOMY  2017       Social History     Socioeconomic History   . Marital status: Married     Spouse name: None   . Number of children: None   . Years of education: None   . Highest education level: None   Occupational History   . None   Social Needs   . Financial resource strain: None   . Food insecurity:     Worry: None     Inability: None   . Transportation needs:     Medical: None     Non-medical: None   Tobacco Use   . Smoking status: Former Smoker     Packs/day: 0.50     Years: 8.00     Pack  years: 4.00     Types: Cigarettes     Last attempt to quit: 2007     Years since quitting: 12.6   . Smokeless tobacco: Former Neurosurgeon     Types: Snuff     Quit date: 1997   Substance and Sexual Activity   . Alcohol use: Yes     Comment: RARELY   . Drug use: No   . Sexual activity: Yes     Partners: Female   Lifestyle   . Physical activity:     Days per week: None     Minutes per session: None   . Stress: None   Relationships   . Social connections:     Talks on phone: None     Gets together: None     Attends religious service: None     Active member of club or organization:  None     Attends meetings of clubs or organizations: None     Relationship status: None   . Intimate partner violence:     Fear of current or ex partner: None     Emotionally abused: None     Physically abused: None     Forced sexual activity: None   Other Topics Concern   . None   Social History Narrative   . None       Review of Systems   Constitutional: Negative for fatigue and unexpected weight change.   HENT: Negative for trouble swallowing.    Respiratory: Negative for cough and shortness of breath.    Cardiovascular: Negative for chest pain.   Endocrine: Negative for cold intolerance and heat intolerance.   Musculoskeletal: Positive for arthralgias and joint swelling.   Skin: Negative for rash.   Neurological: Negative for headaches.       OBJECTIVE:    BP 126/84 (Site: Left Upper Arm, Position: Sitting, Cuff Size: Large Adult)   Pulse 74   Resp 18   Wt (!) 343 lb (155.6 kg)   BMI 45.25 kg/m     Physical Exam   Constitutional: He is oriented to person, place, and time. He appears well-developed and well-nourished. No distress.   Neck: Normal range of motion. Neck supple. No thyromegaly present.   Cardiovascular: Normal rate, regular rhythm and normal heart sounds.   Pulmonary/Chest: Effort normal and breath sounds normal.   Musculoskeletal:        Left knee: He exhibits decreased range of motion, effusion and bony tenderness. No  tenderness found. No medial joint line and no lateral joint line tenderness noted.   Neurological: He is alert and oriented to person, place, and time.   Skin: Skin is warm and dry.       ASSESSMENT/PLAN:    Problem List        Musculoskeletal and Integument    Complex tear of medial meniscus of left knee as current injury     Refill noroc, f/u with ortho as scheduled         Relevant Medications    HYDROcodone-acetaminophen (NORCO) 5-325 MG per tablet    Tricompartment osteoarthritis of left knee - Primary     Refill norco, Risks/benefits/SE reviewed, pt voices understanding           Relevant Medications    HYDROcodone-acetaminophen (NORCO) 5-325 MG per tablet       Other    Chronic pain of left knee     Refill pain med         Relevant Medications    HYDROcodone-acetaminophen (NORCO) 5-325 MG per tablet    Class 3 severe obesity without serious comorbidity with body mass index (BMI) of 45.0 to 49.9 in adult (HCC)     Will start belviq, Risks/benefits/SE reviewed, pt voices understanding  Recheck one month         Relevant Medications    Lorcaserin HCl 10 MG TABS          Periodic Controlled Substance Monitoring: Possible medication side effects, risk of tolerance/dependence & alternative treatments discussed., No signs of potential drug abuse or diversion identified., Obtaining appropriate analgesic effect of treatment. Alycia Patten, PA-C)    Return in about 4 weeks (around 01/02/2018).

## 2017-12-06 NOTE — Assessment & Plan Note (Signed)
Refill norco, Risks/benefits/SE reviewed, pt voices understanding

## 2017-12-06 NOTE — Assessment & Plan Note (Signed)
Refill noroc, f/u with ortho as scheduled

## 2017-12-06 NOTE — Assessment & Plan Note (Signed)
Refill pain med

## 2017-12-06 NOTE — Assessment & Plan Note (Signed)
Will start belviq, Risks/benefits/SE reviewed, pt voices understanding  Recheck one month

## 2017-12-13 NOTE — Telephone Encounter (Signed)
Can [facility] call [facility] on [address] and give them verbal ok to change belviq to bid? Wife called and insurance won't cover it unless its written for bid. Thank you

## 2017-12-18 NOTE — Telephone Encounter (Signed)
Left a message for [facility] [address] with the verbal ok to change the Belviq rx to twice a day instead of once a day in order for the pt to be able to use the coupon card given to them. Told [facility] to call with any further questions.

## 2018-01-09 ENCOUNTER — Ambulatory Visit
Admit: 2018-01-09 | Discharge: 2018-01-09 | Payer: PRIVATE HEALTH INSURANCE | Attending: Family Medicine | Primary: Family Medicine

## 2018-01-09 DIAGNOSIS — S83232D Complex tear of medial meniscus, current injury, left knee, subsequent encounter: Secondary | ICD-10-CM

## 2018-01-09 MED ORDER — HYDROCODONE-ACETAMINOPHEN 7.5-325 MG PO TABS
7.5-325 MG | ORAL_TABLET | Freq: Three times a day (TID) | ORAL | 0 refills | Status: AC | PRN
Start: 2018-01-09 — End: 2018-02-08

## 2018-01-09 NOTE — Progress Notes (Signed)
Scott Jacobs  06/09/1975  42 y.o.  male    SUBJECTIVE:    Chief Complaint   Patient presents with   . 1 Month Follow-Up     Pt here for 1 month follow up       HPI  Chronic knee pain-Pt has complex tear of medial meniscus/OA/previous ACL tear. Will most likely be having surgery in December for repair (during shut down-works at [employer]). Pt using norco prn to manage severe pain-stands/walks long distance during shift at [employer]. Pain sometimes "unbearable" by end of day. Does also try to use OTC pain relievers with mild to moderate relief at times.    Current Outpatient Medications on File Prior to Visit   Medication Sig Dispense Refill   . levothyroxine (SYNTHROID) 200 MCG tablet TAKE 1 TABLET BY MOUTH EVERY DAY 90 tablet 3     No current facility-administered medications on file prior to visit.      Review of PMH, PSH, Family Hx, Allergies and updates made as needed.      Allergies   Allergen Reactions   . Azithromycin Hives and Itching   . Clindamycin Hives and Itching       Past Medical History:   Diagnosis Date   . Hypothyroidism    . Knee pain        Past Surgical History:   Procedure Laterality Date   . ANTERIOR CRUCIATE LIGAMENT REPAIR Left     X2   . NASAL SEPTUM SURGERY     . TONSILLECTOMY  2017       Social History     Socioeconomic History   . Marital status: Married     Spouse name: None   . Number of children: None   . Years of education: None   . Highest education level: None   Occupational History   . None   Social Needs   . Financial resource strain: None   . Food insecurity:     Worry: None     Inability: None   . Transportation needs:     Medical: None     Non-medical: None   Tobacco Use   . Smoking status: Former Smoker     Packs/day: 0.50     Years: 8.00     Pack years: 4.00     Types: Cigarettes     Last attempt to quit: 2007     Years since quitting: 12.7   . Smokeless tobacco: Former Neurosurgeon     Types: Snuff     Quit date: 1997   Substance and Sexual Activity   . Alcohol use: Yes      Comment: RARELY   . Drug use: No   . Sexual activity: Yes     Partners: Female   Lifestyle   . Physical activity:     Days per week: None     Minutes per session: None   . Stress: None   Relationships   . Social connections:     Talks on phone: None     Gets together: None     Attends religious service: None     Active member of club or organization: None     Attends meetings of clubs or organizations: None     Relationship status: None   . Intimate partner violence:     Fear of current or ex partner: None     Emotionally abused: None     Physically abused: None  Forced sexual activity: None   Other Topics Concern   . None   Social History Narrative   . None       Review of Systems   Constitutional: Negative for chills and fever.   Respiratory: Negative for cough.    Musculoskeletal: Positive for arthralgias.   Skin: Negative for rash.   Psychiatric/Behavioral: Negative for dysphoric mood. The patient is not nervous/anxious.        OBJECTIVE:    BP 132/76 (Site: Right Upper Arm, Position: Sitting, Cuff Size: Large Adult)   Pulse 80   Resp 16   Wt (!) 345 lb 9.6 oz (156.8 kg)   BMI 45.60 kg/m     Physical Exam   Constitutional: He is oriented to person, place, and time. He appears well-developed and well-nourished. No distress.   Cardiovascular: Normal rate and regular rhythm.   Pulmonary/Chest: Effort normal and breath sounds normal.   Musculoskeletal:        Left knee: He exhibits bony tenderness. Tenderness found. Medial joint line and lateral joint line tenderness noted.   Neurological: He is alert and oriented to person, place, and time.   Skin: Skin is warm and dry.       ASSESSMENT/PLAN:    Problem List        Musculoskeletal and Integument    Complex tear of medial meniscus of left knee as current injury - Primary     Will refill norco-did discuss slightly higher dose with less frequency. Risks/benefits/SE reviewed, pt voices understanding  OARRS reviewed, f/u prn         Relevant Medications     HYDROcodone-acetaminophen (NORCO) 7.5-325 MG per tablet    Tricompartment osteoarthritis of left knee    Relevant Medications    HYDROcodone-acetaminophen (NORCO) 7.5-325 MG per tablet       Other    Chronic pain of left knee    Relevant Medications    HYDROcodone-acetaminophen (NORCO) 7.5-325 MG per tablet          Periodic Controlled Substance Monitoring: Possible medication side effects, risk of tolerance/dependence & alternative treatments discussed., No signs of potential drug abuse or diversion identified., Obtaining appropriate analgesic effect of treatment. Alycia Patten, PA-C)    No follow-ups on file.

## 2018-01-10 NOTE — Assessment & Plan Note (Signed)
Will refill norco-did discuss slightly higher dose with less frequency. Risks/benefits/SE reviewed, pt voices understanding  OARRS reviewed, f/u prn

## 2018-02-10 ENCOUNTER — Ambulatory Visit
Admit: 2018-02-10 | Discharge: 2018-02-10 | Payer: PRIVATE HEALTH INSURANCE | Attending: Family Medicine | Primary: Family Medicine

## 2018-02-10 DIAGNOSIS — S83232D Complex tear of medial meniscus, current injury, left knee, subsequent encounter: Secondary | ICD-10-CM

## 2018-02-10 MED ORDER — HYDROCODONE-ACETAMINOPHEN 5-325 MG PO TABS
5-325 | ORAL_TABLET | Freq: Four times a day (QID) | ORAL | 0 refills | Status: DC | PRN
Start: 2018-02-10 — End: 2018-03-10

## 2018-02-10 NOTE — Progress Notes (Signed)
Scott Jacobs  Aug 11, 1975  42 y.o.  male    SUBJECTIVE:    Chief Complaint   Patient presents with   ??? Knee Pain     patient is having left knee pain having surgery in december    ??? Flu Vaccine     patient declines       HPI  Chronic left knee pain-pt scheduled for knee repair/surgery 03/31/18 with surgeon. Pain is increasingly worse. Works at [employer] and stands on concrete floor during 8-10 hour shifts. Tried increasing norco last OV but higher dose made him sick. Would like to go back to lower dose and is hoping to make meds last until surgery.    Current Outpatient Medications on File Prior to Visit   Medication Sig Dispense Refill   ??? levothyroxine (SYNTHROID) 200 MCG tablet TAKE 1 TABLET BY MOUTH EVERY DAY 90 tablet 3     No current facility-administered medications on file prior to visit.      Review of PMH, PSH, Family Hx, Allergies and updates made as needed.    Allergies   Allergen Reactions   ??? Azithromycin Hives and Itching   ??? Clindamycin Hives and Itching       Past Medical History:   Diagnosis Date   ??? Hypothyroidism    ??? Knee pain        Past Surgical History:   Procedure Laterality Date   ??? ANTERIOR CRUCIATE LIGAMENT REPAIR Left     X2   ??? NASAL SEPTUM SURGERY     ??? TONSILLECTOMY  2017       Social History     Socioeconomic History   ??? Marital status: Married     Spouse name: None   ??? Number of children: None   ??? Years of education: None   ??? Highest education level: None   Occupational History   ??? None   Social Needs   ??? Financial resource strain: None   ??? Food insecurity:     Worry: None     Inability: None   ??? Transportation needs:     Medical: None     Non-medical: None   Tobacco Use   ??? Smoking status: Former Smoker     Packs/day: 0.50     Years: 8.00     Pack years: 4.00     Types: Cigarettes     Last attempt to quit: 2007     Years since quitting: 12.8   ??? Smokeless tobacco: Former Neurosurgeon     Types: Snuff     Quit date: 1997   Substance and Sexual Activity   ??? Alcohol use: Yes     Comment:  RARELY   ??? Drug use: No   ??? Sexual activity: Yes     Partners: Female   Lifestyle   ??? Physical activity:     Days per week: None     Minutes per session: None   ??? Stress: None   Relationships   ??? Social connections:     Talks on phone: None     Gets together: None     Attends religious service: None     Active member of club or organization: None     Attends meetings of clubs or organizations: None     Relationship status: None   ??? Intimate partner violence:     Fear of current or ex partner: None     Emotionally abused: None     Physically abused:  None     Forced sexual activity: None   Other Topics Concern   ??? None   Social History Narrative   ??? None       Review of Systems   Respiratory: Negative for cough and shortness of breath.    Cardiovascular: Negative for chest pain.   Musculoskeletal: Positive for arthralgias, back pain, gait problem and joint swelling.       OBJECTIVE:    BP 124/82 (Site: Right Upper Arm, Position: Sitting, Cuff Size: Large Adult)    Pulse 94    Resp 16    Wt (!) 349 lb 9.6 oz (158.6 kg)    BMI 46.12 kg/m??     Physical Exam   Constitutional: He is oriented to person, place, and time. He appears well-developed and well-nourished.   Musculoskeletal:        Left knee: He exhibits decreased range of motion, swelling and bony tenderness. Tenderness found.   Neurological: He is alert and oriented to person, place, and time.   Skin: Skin is warm and dry.       ASSESSMENT/PLAN:    Problem List        Musculoskeletal and Integument    Complex tear of medial meniscus of left knee as current injury - Primary    Relevant Medications    HYDROcodone-acetaminophen (NORCO) 5-325 MG per tablet    Tricompartment osteoarthritis of left knee    Relevant Medications    HYDROcodone-acetaminophen (NORCO) 5-325 MG per tablet    ACL graft tear (HCC)    Relevant Medications    HYDROcodone-acetaminophen (NORCO) 5-325 MG per tablet       Other    Chronic pain of left knee    Relevant Medications     HYDROcodone-acetaminophen (NORCO) 5-325 MG per tablet               No follow-ups on file.

## 2018-03-10 ENCOUNTER — Ambulatory Visit
Admit: 2018-03-10 | Discharge: 2018-03-10 | Payer: PRIVATE HEALTH INSURANCE | Attending: Family Medicine | Primary: Family Medicine

## 2018-03-10 DIAGNOSIS — S83232D Complex tear of medial meniscus, current injury, left knee, subsequent encounter: Secondary | ICD-10-CM

## 2018-03-10 MED ORDER — IBUPROFEN 800 MG PO TABS
800 MG | ORAL_TABLET | Freq: Three times a day (TID) | ORAL | 1 refills | Status: DC
Start: 2018-03-10 — End: 2018-09-12

## 2018-03-10 MED ORDER — HYDROCODONE-ACETAMINOPHEN 5-325 MG PO TABS
5-325 MG | ORAL_TABLET | Freq: Four times a day (QID) | ORAL | 0 refills | Status: AC | PRN
Start: 2018-03-10 — End: 2018-04-09

## 2018-03-10 NOTE — Progress Notes (Signed)
Scott Jacobs  10-02-1975  42 y.o.  male    SUBJECTIVE:    Chief Complaint   Patient presents with   ??? Knee Pain     patient is having left knee pain        HPI   Left knee pain-chronic, scheduled for surgical repair of meniscus tear/ACL tear 03/28/18. 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 Using up to 800 mg ibuprofen at times with mild relief of pain. Has been using norco routinely for several months to "get through until my surgery". This does help with his more severe pain, usually takes after getting home from work and at bedtime.    Current Outpatient Medications on File Prior to Visit   Medication Sig Dispense Refill   ??? levothyroxine (SYNTHROID) 200 MCG tablet TAKE 1 TABLET BY MOUTH EVERY DAY 90 tablet 3     No current facility-administered medications on file prior to visit.      Review of PMH, PSH, Family Hx, Allergies and updates made as needed.    PHQ Scores 03/10/2018 02/10/2018 12/05/2017 10/18/2017 08/27/2017 05/16/2017 02/05/2017   PHQ2 Score 0 0 0 0 0 0 0   PHQ9 Score 0 0 0 0 0 0 0     Interpretation of Total Score Depression Severity: 1-4 = Minimal depression, 5-9 = Mild depression, 10-14 = Moderate depression, 15-19 = Moderately severe depression, 20-27 = Severe depression      Allergies   Allergen Reactions   ??? Azithromycin Hives and Itching   ??? Clindamycin Hives and Itching       Past Medical History:   Diagnosis Date   ??? Hypothyroidism    ??? Knee pain        Past Surgical History:   Procedure Laterality Date   ??? ANTERIOR CRUCIATE LIGAMENT REPAIR Left     X2   ??? NASAL SEPTUM SURGERY     ??? TONSILLECTOMY  2017       Social History     Socioeconomic History   ??? Marital status: Married     Spouse name: None   ??? Number of children: None   ??? Years of education: None   ??? Highest education level: None   Occupational History   ??? None   Social Needs   ??? Financial resource strain: None   ??? Food insecurity:     Worry: None      Inability: None   ??? Transportation needs:     Medical: None     Non-medical: None   Tobacco Use   ??? Smoking status: Former Smoker     Packs/day: 0.50     Years: 8.00     Pack years: 4.00     Types: Cigarettes     Last attempt to quit: 2007     Years since quitting: 12.9   ??? Smokeless tobacco: Former Neurosurgeon     Types: Snuff     Quit date: 1997   Substance and Sexual Activity   ??? Alcohol use: Yes     Comment: RARELY   ??? Drug use: No   ??? Sexual activity: Yes     Partners: Female   Lifestyle   ??? Physical activity:     Days per week: None     Minutes per session: None   ??? Stress: None   Relationships   ??? Social connections:  Talks on phone: None     Gets together: None     Attends religious service: None     Active member of club or organization: None     Attends meetings of clubs or organizations: None     Relationship status: None   ??? Intimate partner violence:     Fear of current or ex partner: None     Emotionally abused: None     Physically abused: None     Forced sexual activity: None   Other Topics Concern   ??? None   Social History Narrative   ??? None       Review of Systems   Respiratory: Negative for cough and shortness of breath.    Cardiovascular: Negative for chest pain.   Gastrointestinal: Negative for constipation.   Musculoskeletal: Positive for arthralgias.   Skin: Negative for wound.   Hematological: Does not bruise/bleed easily.       OBJECTIVE:    BP 120/78 (Site: Left Upper Arm, Position: Sitting, Cuff Size: Large Adult)    Pulse 60    Resp 16    Wt (!) 353 lb 6.4 oz (160.3 kg)    BMI 46.63 kg/m??     Physical Exam  Constitutional:       Appearance: Normal appearance. He is obese.   Cardiovascular:      Rate and Rhythm: Normal rate and regular rhythm.   Pulmonary:      Effort: Pulmonary effort is normal.      Breath sounds: Normal breath sounds.   Musculoskeletal:      Left knee: He exhibits decreased range of motion and swelling. Tenderness found.   Skin:     General: Skin is warm and dry.    Neurological:      Mental Status: He is alert and oriented to person, place, and time.         ASSESSMENT/PLAN:    Problem List        Musculoskeletal and Integument    Complex tear of medial meniscus of left knee as current injury    Relevant Medications    HYDROcodone-acetaminophen (NORCO) 5-325 MG per tablet    Tricompartment osteoarthritis of left knee    Relevant Medications    ibuprofen (ADVIL;MOTRIN) 800 MG tablet    HYDROcodone-acetaminophen (NORCO) 5-325 MG per tablet    ACL graft tear (HCC)    Relevant Medications    HYDROcodone-acetaminophen (NORCO) 5-325 MG per tablet       Other    Chronic pain of left knee     Will refill norco today, pt scheduled for surgery 03/28/18 so hopefully will no longer need routine pain med after recovery period. Continue motrin prn         Relevant Medications    ibuprofen (ADVIL;MOTRIN) 800 MG tablet    HYDROcodone-acetaminophen (NORCO) 5-325 MG per tablet          Periodic Controlled Substance Monitoring: Possible medication side effects, risk of tolerance/dependence & alternative treatments discussed., No signs of potential drug abuse or diversion identified. Alycia Patten(Dayanira Giovannetti, PA-C)    Return if symptoms worsen or fail to improve.

## 2018-03-11 NOTE — Assessment & Plan Note (Signed)
Will refill norco today, pt scheduled for surgery 03/28/18 so hopefully will no longer need routine pain med after recovery period. Continue motrin prn

## 2018-04-16 ENCOUNTER — Encounter

## 2018-04-16 ENCOUNTER — Ambulatory Visit
Admit: 2018-04-16 | Discharge: 2018-04-16 | Payer: PRIVATE HEALTH INSURANCE | Attending: Family Medicine | Primary: Family Medicine

## 2018-04-16 DIAGNOSIS — M25562 Pain in left knee: Secondary | ICD-10-CM

## 2018-04-16 DIAGNOSIS — G8929 Other chronic pain: Secondary | ICD-10-CM

## 2018-04-16 MED ORDER — HYDROCODONE-ACETAMINOPHEN 5-325 MG PO TABS
5-325 MG | ORAL_TABLET | Freq: Four times a day (QID) | ORAL | 0 refills | Status: AC | PRN
Start: 2018-04-16 — End: 2018-05-16

## 2018-04-16 NOTE — Progress Notes (Signed)
Scott DOBEK  Jan 25, 1976  43 y.o.  male    SUBJECTIVE:    Chief Complaint   Patient presents with   ??? Knee Pain     patient is having left knee pain denies having numbness or tingling in his knee or leg        HPI   Left knee pain-pt had reconstructive surgery last month, returned to work after two weeks (surgeon recommended four weeks off), pt did not return to light duty as suggested also. Pt was doing well after surgery but since resuming work at [employer], pt having increasing pain/swelling/limping left knee. Has appt with surgeon in 2 weeks for f/u.     Fatigue-x several months, getting worse. Has hx low testosterone and hypothyroidism    Current Outpatient Medications on File Prior to Visit   Medication Sig Dispense Refill   ??? ibuprofen (ADVIL;MOTRIN) 800 MG tablet Take 1 tablet by mouth 3 times daily (with meals) 270 tablet 1   ??? levothyroxine (SYNTHROID) 200 MCG tablet TAKE 1 TABLET BY MOUTH EVERY DAY 90 tablet 3     No current facility-administered medications on file prior to visit.      Review of PMH, PSH, Family Hx, Allergies and updates made as needed.    PHQ Scores 04/16/2018 03/10/2018 02/10/2018 12/05/2017 10/18/2017 08/27/2017 05/16/2017   PHQ2 Score 0 0 0 0 0 0 0   PHQ9 Score 0 0 0 0 0 0 0     Interpretation of Total Score Depression Severity: 1-4 = Minimal depression, 5-9 = Mild depression, 10-14 = Moderate depression, 15-19 = Moderately severe depression, 20-27 = Severe depression      Allergies   Allergen Reactions   ??? Azithromycin Hives and Itching   ??? Clindamycin Hives and Itching       Past Medical History:   Diagnosis Date   ??? Hypothyroidism    ??? Knee pain        Past Surgical History:   Procedure Laterality Date   ??? ANTERIOR CRUCIATE LIGAMENT REPAIR Left     X2   ??? NASAL SEPTUM SURGERY     ??? TONSILLECTOMY  2017       Social History     Socioeconomic History   ??? Marital status: Married     Spouse name: None   ??? Number of children: None   ??? Years of education: None   ??? Highest education level:  None   Occupational History   ??? None   Social Needs   ??? Financial resource strain: None   ??? Food insecurity:     Worry: None     Inability: None   ??? Transportation needs:     Medical: None     Non-medical: None   Tobacco Use   ??? Smoking status: Former Smoker     Packs/day: 0.50     Years: 8.00     Pack years: 4.00     Types: Cigarettes     Last attempt to quit: 2007     Years since quitting: 13.0   ??? Smokeless tobacco: Former Neurosurgeon     Types: Snuff     Quit date: 1997   Substance and Sexual Activity   ??? Alcohol use: Yes     Comment: RARELY   ??? Drug use: No   ??? Sexual activity: Yes     Partners: Female   Lifestyle   ??? Physical activity:     Days per week: None  Minutes per session: None   ??? Stress: None   Relationships   ??? Social connections:     Talks on phone: None     Gets together: None     Attends religious service: None     Active member of club or organization: None     Attends meetings of clubs or organizations: None     Relationship status: None   ??? Intimate partner violence:     Fear of current or ex partner: None     Emotionally abused: None     Physically abused: None     Forced sexual activity: None   Other Topics Concern   ??? None   Social History Narrative   ??? None       Review of Systems   Constitutional: Positive for fatigue. Negative for fever and unexpected weight change.   Respiratory: Negative for cough.    Cardiovascular: Negative for chest pain.   Gastrointestinal: Negative.    Endocrine: Negative for cold intolerance, heat intolerance, polydipsia, polyphagia and polyuria.   Musculoskeletal: Positive for arthralgias. Negative for back pain.   Skin: Negative for rash.   Neurological: Negative for dizziness and headaches.   Psychiatric/Behavioral: Negative for dysphoric mood.       OBJECTIVE:    BP (!) 140/86 (Site: Left Upper Arm, Position: Sitting, Cuff Size: Large Adult)    Pulse 91    Resp 16    Wt (!) 360 lb 9.6 oz (163.6 kg)    BMI 47.58 kg/m??     Physical Exam  Constitutional:        Appearance: Normal appearance.   HENT:      Head: Normocephalic.      Nose: Nose normal.      Mouth/Throat:      Mouth: Mucous membranes are moist.      Pharynx: Oropharynx is clear.   Cardiovascular:      Rate and Rhythm: Normal rate and regular rhythm.   Pulmonary:      Effort: Pulmonary effort is normal.      Breath sounds: Normal breath sounds.   Musculoskeletal:         General: Swelling and tenderness present.      Left knee: He exhibits swelling. Tenderness found.   Skin:     General: Skin is warm and dry.   Neurological:      Mental Status: He is alert and oriented to person, place, and time.         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 F/u with surgeon as scheduled         Relevant Medications    ibuprofen (ADVIL;MOTRIN) 800 MG tablet    HYDROcodone-acetaminophen (NORCO) 5-325 MG per tablet    Other fatigue    Relevant Orders    TSH WITH REFLEX TO FT4 (Completed)    Testosterone, free, total  Periodic Controlled Substance Monitoring: Possible medication side effects, risk of tolerance/dependence & alternative treatments discussed., No signs of potential drug abuse or diversion identified., Obtaining appropriate analgesic effect of treatment. Alycia Patten(Havah Ammon, PA-C)    Return if symptoms worsen or fail to improve.

## 2018-04-17 LAB — TSH REFLEX TO FT4, T3: TSH Reflex FT4: 1.51 u[IU]/mL (ref 0.27–4.20)

## 2018-04-17 NOTE — Assessment & Plan Note (Signed)
Will refill pain meds today, discussed weaning dose if pt needs to continue past this script. F/u with surgeon as scheduled

## 2018-04-18 ENCOUNTER — Encounter: Payer: PRIVATE HEALTH INSURANCE | Attending: Family Medicine | Primary: Family Medicine

## 2018-04-18 LAB — TESTOSTERONE, FREE, TOTAL
Sex Hormone Binding: 15 nmol/L (ref 11–80)
Testosterone, Free: 41.6 pg/mL — ABNORMAL LOW (ref 47–244)
Testosterone: 152 ng/dL — ABNORMAL LOW (ref 220–1000)

## 2018-06-20 ENCOUNTER — Ambulatory Visit
Admit: 2018-06-20 | Discharge: 2018-06-20 | Payer: PRIVATE HEALTH INSURANCE | Attending: Family Medicine | Primary: Family Medicine

## 2018-06-20 DIAGNOSIS — E039 Hypothyroidism, unspecified: Secondary | ICD-10-CM

## 2018-06-20 MED ORDER — HYDROCODONE-ACETAMINOPHEN 5-325 MG PO TABS
5-325 | ORAL_TABLET | Freq: Four times a day (QID) | ORAL | 0 refills | Status: AC | PRN
Start: 2018-06-20 — End: 2018-07-20

## 2018-06-20 MED ORDER — LEVOTHYROXINE SODIUM 112 MCG PO TABS
112 MCG | ORAL_TABLET | ORAL | 2 refills | Status: DC
Start: 2018-06-20 — End: 2018-07-14

## 2018-06-20 NOTE — Progress Notes (Signed)
Scott SHI  Feb 19, 1976  43 y.o.  male    SUBJECTIVE:    Chief Complaint   Patient presents with   . Discuss Labs     patient is here to discuss lab results        HPI  Fatigue-here to review recent tests, lower testosterone per random draw. Pt states he is not interested in repeating levels or possible replacement therapy. Has been on medication in past and did not feel it helped. States previous provider increased thyroid meds in past and this seemed to help more than anything else. 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 Usually has severe pain unrelieved with OTC meds by end of day-causes difficulty completing shift/driving/sleeping once he is home.    Current Outpatient Medications on File Prior to Visit   Medication Sig Dispense Refill   . ibuprofen (ADVIL;MOTRIN) 800 MG tablet Take 1 tablet by mouth 3 times daily (with meals) 270 tablet 1     No current facility-administered medications on file prior to visit.      Review of PMH, PSH, Family Hx, Allergies and updates made as needed.    PHQ Scores 06/20/2018 04/16/2018 03/10/2018 02/10/2018 12/05/2017 10/18/2017 08/27/2017   PHQ2 Score 0 0 0 0 0 0 0   PHQ9 Score 0 0 0 0 0 0 0     Interpretation of Total Score Depression Severity: 1-4 = Minimal depression, 5-9 = Mild depression, 10-14 = Moderate depression, 15-19 = Moderately severe depression, 20-27 = Severe depression    Allergies   Allergen Reactions   . Azithromycin Hives and Itching   . Clindamycin Hives and Itching       Past Medical History:   Diagnosis Date   . Hypothyroidism    . Knee pain        Past Surgical History:   Procedure Laterality Date   . ANTERIOR CRUCIATE LIGAMENT REPAIR Left     X2   . NASAL SEPTUM SURGERY     . TONSILLECTOMY  2017       Social History     Socioeconomic History   . Marital  status: Married     Spouse name: None   . Number of children: None   . Years of education: None   . Highest education level: None   Occupational History   . None   Social Needs   . Financial resource strain: None   . Food insecurity     Worry: None     Inability: None   . Transportation needs     Medical: None     Non-medical: None   Tobacco Use   . Smoking status: Former Smoker     Packs/day: 0.50     Years: 8.00     Pack years: 4.00     Types: Cigarettes     Last attempt to quit: 2007     Years since quitting: 13.2   . Smokeless tobacco: Former Neurosurgeon     Types: Snuff     Quit date: 1997   Substance and Sexual Activity   . Alcohol use: Yes     Comment: RARELY   . Drug use: No   . Sexual activity: Yes     Partners: Female   Lifestyle   . Physical activity  Days per week: None     Minutes per session: None   . Stress: None   Relationships   . Social Wellsite geologist on phone: None     Gets together: None     Attends religious service: None     Active member of club or organization: None     Attends meetings of clubs or organizations: None     Relationship status: None   . Intimate partner violence     Fear of current or ex partner: None     Emotionally abused: None     Physically abused: None     Forced sexual activity: None   Other Topics Concern   . None   Social History Narrative   . None       Review of Systems   Constitutional: Positive for fatigue. Negative for chills, fever and unexpected weight change.   Respiratory: Negative for cough and shortness of breath.    Cardiovascular: Negative for chest pain.   Gastrointestinal: Negative for abdominal pain.   Endocrine: Negative for cold intolerance, heat intolerance, polydipsia, polyphagia and polyuria.   Musculoskeletal: Positive for arthralgias and joint swelling.   Neurological: Negative for light-headedness and headaches.       OBJECTIVE:    BP (!) 150/90 (Site: Right Upper Arm, Position: Sitting, Cuff Size: Large Adult)   Pulse 92   Temp 97.7 F  (36.5 C) (Temporal)   Resp 16   Wt (!) 353 lb 3.2 oz (160.2 kg)   SpO2 97%   BMI 46.60 kg/m     Physical Exam  Constitutional:       Appearance: Normal appearance. He is obese.   Neck:      Musculoskeletal: Normal range of motion and neck supple.   Cardiovascular:      Rate and Rhythm: Normal rate and regular rhythm.   Pulmonary:      Effort: Pulmonary effort is normal.      Breath sounds: Normal breath sounds.   Musculoskeletal:         General: Swelling and tenderness present.      Left knee: He exhibits effusion. Tenderness found. Medial joint line and lateral joint line tenderness noted.   Skin:     General: Skin is warm and dry.   Neurological:      Mental Status: He is alert and oriented to person, place, and time.         ASSESSMENT/PLAN:    Problem List        Endocrine    Acquired hypothyroidism - Primary     Will increase synthroid dose slightly, recheck TSH in 4-6 weeks         Relevant Medications    levothyroxine (SYNTHROID) 112 MCG tablet    Other Relevant Orders    TSH WITH REFLEX TO FT4       Other    Other fatigue     See above         Chronic pain of left knee     Refill norco-Risks/benefits/SE reviewed, pt voices understanding  , f/u with ortho as scheduled 08/07/2018         Relevant Medications    ibuprofen (ADVIL;MOTRIN) 800 MG tablet    HYDROcodone-acetaminophen (NORCO) 5-325 MG per tablet          Periodic Controlled Substance Monitoring: Possible medication side effects, risk of tolerance/dependence & alternative treatments discussed., No signs of potential drug abuse or diversion identified.,  Assessed functional status., Obtaining appropriate analgesic effect of treatment. Alycia Patten, PA-C)    No follow-ups on file.

## 2018-06-22 NOTE — Assessment & Plan Note (Signed)
Refill norco-Risks/benefits/SE reviewed, pt voices understanding  , f/u with ortho as scheduled 08/07/2018

## 2018-06-22 NOTE — Assessment & Plan Note (Signed)
Will increase synthroid dose slightly, recheck TSH in 4-6 weeks

## 2018-06-22 NOTE — Assessment & Plan Note (Signed)
See above

## 2018-07-14 MED ORDER — LEVOTHYROXINE SODIUM 112 MCG PO TABS
112 MCG | ORAL_TABLET | ORAL | 2 refills | Status: DC
Start: 2018-07-14 — End: 2018-10-09

## 2018-09-12 MED ORDER — IBUPROFEN 800 MG PO TABS
800 MG | ORAL_TABLET | ORAL | 1 refills | Status: DC
Start: 2018-09-12 — End: 2018-12-19

## 2018-09-16 ENCOUNTER — Telehealth
Admit: 2018-09-16 | Discharge: 2018-09-16 | Payer: PRIVATE HEALTH INSURANCE | Attending: Family Medicine | Primary: Family Medicine

## 2018-09-16 DIAGNOSIS — E039 Hypothyroidism, unspecified: Secondary | ICD-10-CM

## 2018-09-16 MED ORDER — HYDROCODONE-ACETAMINOPHEN 5-325 MG PO TABS
5-325 | ORAL_TABLET | Freq: Four times a day (QID) | ORAL | 0 refills | Status: AC | PRN
Start: 2018-09-16 — End: 2018-10-16

## 2018-09-16 NOTE — Progress Notes (Signed)
Scott Jacobs  07-18-1975  43 y.o.  male    SUBJECTIVE:    Chief Complaint   Patient presents with   ??? Thyroid Problem   ??? Fatigue     Due to COVID-19 related state of emergency restrictions , as an alternative to an in-person session, the clinical decision was made to utilize a virtual visit to provide services for this patient's visit. These services were provided via video with the patient in their home while I was located at office. Identity was confirmed via patient name and DOB. Verbal consent for use of telehealth was provided to and completed by the patient.      HPI   Fatigue-pt c/o increasing fatigue/feeling tired all the time. Has hypothyroidism and had meds adjusted several months ago, needs TSH rechecked. Pt states sister just had labs done and has multiple vitamin deficiencies. Would like labs done to check for that too.    Chronic left knee pain-pt has had ACL graft tear with repair. Continues to have moderate to severe pain at times. 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 Usually has severe knee pain by end of shift. Is going to [place] end of month and would like refill today.    Current Outpatient Medications on File Prior to Visit   Medication Sig Dispense Refill   ??? ibuprofen (ADVIL;MOTRIN) 800 MG tablet TAKE 1 TABLET BY MOUTH 3 TIMES DAILY WITH MEALS 270 tablet 1   ??? levothyroxine (SYNTHROID) 112 MCG tablet TAKE 2 TABLETS BY MOUTH EVERY DAY 60 tablet 2     No current facility-administered medications on file prior to visit.      Review of PMH, PSH, Family Hx, Allergies and updates made as needed.    PHQ Scores 06/20/2018 04/16/2018 03/10/2018 02/10/2018 12/05/2017 10/18/2017 08/27/2017   PHQ2 Score 0 0 0 0 0 0 0   PHQ9 Score 0 0 0 0 0 0 0     Interpretation of Total Score Depression Severity: 1-4 = Minimal depression, 5-9 = Mild depression, 10-14 = Moderate depression, 15-19 = Moderately severe depression, 20-27 = Severe depression      Allergies    Allergen Reactions   ??? Azithromycin Hives and Itching   ??? Clindamycin Hives and Itching       Past Medical History:   Diagnosis Date   ??? Hypothyroidism    ??? Knee pain        Past Surgical History:   Procedure Laterality Date   ??? ANTERIOR CRUCIATE LIGAMENT REPAIR Left     X2   ??? NASAL SEPTUM SURGERY     ??? TONSILLECTOMY  2017       Social History     Socioeconomic History   ??? Marital status: Married     Spouse name: Not on file   ??? Number of children: Not on file   ??? Years of education: Not on file   ??? Highest education level: Not on file   Occupational History   ??? Not on file   Social Needs   ??? Financial resource strain: Not on file   ??? Food insecurity     Worry: Not on file     Inability: Not on file   ??? Transportation needs     Medical: Not on file     Non-medical: Not on file   Tobacco Use   ??? Smoking status: Former Smoker     Packs/day: 0.50  Years: 8.00     Pack years: 4.00     Types: Cigarettes     Last attempt to quit: 2007     Years since quitting: 13.4   ??? Smokeless tobacco: Former NeurosurgeonUser     Types: Snuff     Quit date: 1997   Substance and Sexual Activity   ??? Alcohol use: Yes     Comment: RARELY   ??? Drug use: No   ??? Sexual activity: Yes     Partners: Female   Lifestyle   ??? Physical activity     Days per week: Not on file     Minutes per session: Not on file   ??? Stress: Not on file   Relationships   ??? Social Wellsite geologistconnections     Talks on phone: Not on file     Gets together: Not on file     Attends religious service: Not on file     Active member of club or organization: Not on file     Attends meetings of clubs or organizations: Not on file     Relationship status: Not on file   ??? Intimate partner violence     Fear of current or ex partner: Not on file     Emotionally abused: Not on file     Physically abused: Not on file     Forced sexual activity: Not on file   Other Topics Concern   ??? Not on file   Social History Narrative   ??? Not on file       Review of Systems   Constitutional: Positive for fatigue.  Negative for chills and fever.   Respiratory: Negative for cough.    Cardiovascular: Positive for leg swelling. Negative for chest pain.   Gastrointestinal: Negative for abdominal pain.   Musculoskeletal: Positive for arthralgias and back pain.   Neurological: Negative for weakness, light-headedness, numbness and headaches.       OBJECTIVE:    There were no vitals taken for this visit.    Physical Exam  Constitutional:       Appearance: Normal appearance. He is obese.   HENT:      Head: Normocephalic.   Pulmonary:      Effort: Pulmonary effort is normal. No respiratory distress.   Neurological:      Mental Status: He is alert and oriented to person, place, and time.   Psychiatric:         Mood and Affect: Mood normal.         ASSESSMENT/PLAN:    Problem List        Endocrine    Acquired hypothyroidism - Primary    Relevant Medications    levothyroxine (SYNTHROID) 112 MCG tablet    Other Relevant Orders    TSH WITH REFLEX TO FT4       Other    Other fatigue     Labs as ordered         Relevant Orders    MAGNESIUM    CBC Auto Differential    Comprehensive Metabolic Panel    VITAMIN D 25 HYDROXY    VITAMIN B12    Chronic pain of left knee     Refill norco, Risks/benefits/SE reviewed, pt voices understanding  Advised to try to continue to wean, pt states he will          Relevant Medications    ibuprofen (ADVIL;MOTRIN) 800 MG tablet    HYDROcodone-acetaminophen (NORCO) 5-325 MG per tablet  Periodic Controlled Substance Monitoring: Possible medication side effects, risk of tolerance/dependence & alternative treatments discussed., No signs of potential drug abuse or diversion identified., Assessed functional status., Obtaining appropriate analgesic effect of treatment. Alycia Patten(Suheyla Mortellaro, PA-C)    Return if symptoms worsen or fail to improve.

## 2018-09-16 NOTE — Assessment & Plan Note (Signed)
Labs as ordered

## 2018-09-16 NOTE — Assessment & Plan Note (Signed)
Refill norco, Risks/benefits/SE reviewed, pt voices understanding  Advised to try to continue to wean, pt states he will

## 2018-10-09 MED ORDER — LEVOTHYROXINE SODIUM 112 MCG PO TABS
112 MCG | ORAL_TABLET | ORAL | 3 refills | Status: DC
Start: 2018-10-09 — End: 2020-01-04

## 2018-12-19 ENCOUNTER — Ambulatory Visit
Admit: 2018-12-19 | Discharge: 2018-12-19 | Payer: PRIVATE HEALTH INSURANCE | Attending: Family Medicine | Primary: Family Medicine

## 2018-12-19 DIAGNOSIS — M25562 Pain in left knee: Secondary | ICD-10-CM

## 2018-12-19 DIAGNOSIS — G8929 Other chronic pain: Secondary | ICD-10-CM

## 2018-12-19 MED ORDER — HYDROCODONE-ACETAMINOPHEN 7.5-325 MG PO TABS
7.5-325 MG | ORAL_TABLET | Freq: Four times a day (QID) | ORAL | 0 refills | Status: DC | PRN
Start: 2018-12-19 — End: 2019-03-26

## 2018-12-19 MED ORDER — IBUPROFEN 800 MG PO TABS
800 MG | ORAL_TABLET | ORAL | 3 refills | Status: DC
Start: 2018-12-19 — End: 2019-03-26

## 2018-12-19 NOTE — Progress Notes (Signed)
Scott Jacobs  Feb 09, 1976  43 y.o.  male    SUBJECTIVE:    Chief Complaint   Patient presents with   ??? Referral - General     patient is here for a referral for an ortho dr.       HPI   Pt here for recheck, has chronic left knee pain. Has ACL graft tear. Has been seeing Dr. Blossom Hoops for above and did have arthroscopic surgery in 03/2018 for meniscal tear. Pt has had continued pain/swelling. Was told by Dr. Blossom Hoops he needs knee replacement but can not have it until age 61. Pt states he would like to see different ortho to discuss other options until that time. 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 Uses ibuprofen and norco prn but does not want to continue narcotic if he can do other treatment options.     Current Outpatient Medications on File Prior to Visit   Medication Sig Dispense Refill   ??? levothyroxine (SYNTHROID) 112 MCG tablet TAKE 2 TABLETS BY MOUTH EVERY DAY 180 tablet 3     No current facility-administered medications on file prior to visit.      Review of PMH, PSH, Family Hx, Allergies and updates made as needed.    PHQ Scores 12/19/2018 06/20/2018 04/16/2018 03/10/2018 02/10/2018 12/05/2017 10/18/2017   PHQ2 Score 0 0 0 0 0 0 0   PHQ9 Score 0 0 0 0 0 0 0     Interpretation of Total Score Depression Severity: 1-4 = Minimal depression, 5-9 = Mild depression, 10-14 = Moderate depression, 15-19 = Moderately severe depression, 20-27 = Severe depression      Allergies   Allergen Reactions   ??? Azithromycin Hives and Itching   ??? Clindamycin Hives and Itching       Past Medical History:   Diagnosis Date   ??? Hypothyroidism    ??? Knee pain        Past Surgical History:   Procedure Laterality Date   ??? ANTERIOR CRUCIATE LIGAMENT REPAIR Left     X2   ??? NASAL SEPTUM SURGERY     ??? TONSILLECTOMY  2017       Social History     Socioeconomic History   ??? Marital status: Married     Spouse name: None   ??? Number of children: None   ??? Years of  education: None   ??? Highest education level: None   Occupational History   ??? None   Social Needs   ??? Financial resource strain: None   ??? Food insecurity     Worry: None     Inability: None   ??? Transportation needs     Medical: None     Non-medical: None   Tobacco Use   ??? Smoking status: Former Smoker     Packs/day: 0.50     Years: 8.00     Pack years: 4.00     Types: Cigarettes     Last attempt to quit: 2007     Years since quitting: 13.7   ??? Smokeless tobacco: Former Systems developer     Types: Snuff     Quit date: 1997   Substance and Sexual Activity   ??? Alcohol use: Yes     Comment: RARELY   ??? Drug use: No   ??? Sexual activity: Yes     Partners: Female  Lifestyle   ??? Physical activity     Days per week: None     Minutes per session: None   ??? Stress: None   Relationships   ??? Social Wellsite geologistconnections     Talks on phone: None     Gets together: None     Attends religious service: None     Active member of club or organization: None     Attends meetings of clubs or organizations: None     Relationship status: None   ??? Intimate partner violence     Fear of current or ex partner: None     Emotionally abused: None     Physically abused: None     Forced sexual activity: None   Other Topics Concern   ??? None   Social History Narrative   ??? None       Review of Systems   Constitutional: Negative for fever.   Respiratory: Negative for cough.    Cardiovascular: Negative for chest pain.   Musculoskeletal: Positive for arthralgias, gait problem and joint swelling.   Skin: Negative for rash.       OBJECTIVE:    BP (!) 148/82 (Site: Left Upper Arm, Position: Sitting, Cuff Size: Large Adult)    Pulse 88    Temp 97.3 ??F (36.3 ??C) (Temporal)    Resp 16    Wt (!) 344 lb 12.8 oz (156.4 kg)    SpO2 97%    BMI 45.49 kg/m??     Physical Exam  Vitals signs reviewed.   Constitutional:       General: He is not in acute distress.     Appearance: Normal appearance. He is obese.   HENT:      Head: Normocephalic.   Cardiovascular:      Rate and Rhythm: Normal rate  and regular rhythm.   Pulmonary:      Effort: Pulmonary effort is normal.      Breath sounds: Normal breath sounds.   Musculoskeletal:      Left knee: He exhibits effusion and bony tenderness. Tenderness found.   Neurological:      Mental Status: He is alert.         ASSESSMENT/PLAN:    Problem List        Musculoskeletal and Integument    ACL graft tear (HCC)    Relevant Medications    HYDROcodone-acetaminophen (NORCO) 7.5-325 MG per tablet    Other Relevant Orders    [place] - Candelaria Celestearozza, Gregory, DO, Orthopedic Surgery, [place]       Other    Chronic pain of left knee - Primary    Relevant Medications    HYDROcodone-acetaminophen (NORCO) 7.5-325 MG per tablet    ibuprofen (ADVIL;MOTRIN) 800 MG tablet    Other Relevant Orders     - Candelaria Celestearozza, Gregory, DO, Orthopedic Surgery, [place]          Periodic Controlled Substance Monitoring: Possible medication side effects, risk of tolerance/dependence & alternative treatments discussed., No signs of potential drug abuse or diversion identified. Alycia Patten(Maziyah Vessel, PA-C)    Return if symptoms worsen or fail to improve.

## 2019-01-07 ENCOUNTER — Encounter: Attending: Orthopaedic Surgery | Primary: Family Medicine

## 2019-01-21 ENCOUNTER — Ambulatory Visit: Admit: 2019-01-21 | Discharge: 2019-01-26 | Payer: PRIVATE HEALTH INSURANCE | Primary: Family Medicine

## 2019-01-21 ENCOUNTER — Ambulatory Visit
Admit: 2019-01-21 | Discharge: 2019-01-21 | Payer: PRIVATE HEALTH INSURANCE | Attending: Orthopaedic Surgery | Primary: Family Medicine

## 2019-01-21 ENCOUNTER — Encounter

## 2019-01-21 DIAGNOSIS — T1490XA Injury, unspecified, initial encounter: Secondary | ICD-10-CM

## 2019-01-21 DIAGNOSIS — M1712 Unilateral primary osteoarthritis, left knee: Secondary | ICD-10-CM

## 2019-01-21 NOTE — Progress Notes (Signed)
Subjective:      Patient ID: Scott Jacobs is a 43 y.o. male.    Patient here for a new patient consult per Alycia Patten, PA-C for evaluation of left knee pain. This is a chronic issue with an extensive history of 3 surgeries: s/p left knee- ACL graft tear, prior surgery in 03/2018, 2007 ACL revision, 1995 ACL reconstruction all performed per Dr Thana Ates. He states the ACL graft tear was not repaired in the last surgery in 2019. He takes Ibuprofen and norco per pcp with minimal relief. He has been working on losing weight and has lost 15lbs. He is in chronic pain and it is starting to interfere with his ADL'S on a regular basis. His gait has become more off balance due to his knee feeling out of "alignment with his leg". He has not had any injections yet and is interested in discussing gel injections in further detail.     Provider needs to conduct a hands on physical today due to patients injury/diagnosis    He comes in today for his first visit with me in regards to his left knee pain.  He states that he has had multiple ACL reconstructions in his left knee and the third 1 has ruptured.  He states that overall he continues to deal with a grinding, deep and throbbing pain globally in his left knee.  He also continues to have intermittent instability and intermittent sharp and stabbing pain in the left knee.  Patient denies any new injury to the involved extremity/ joint, denies numbness or tingling in the involved extremity and denies fever or chills.          Review of Systems   Constitutional: Negative for activity change, chills and fever.   Respiratory: Negative for chest tightness.    Cardiovascular: Negative for chest pain.   Musculoskeletal: Positive for arthralgias, gait problem, joint swelling and myalgias. Negative for back pain.   Skin: Negative for color change, pallor, rash and wound.   Neurological: Negative for weakness and numbness.       Past Medical History:   Diagnosis Date   ??? Hypothyroidism     ??? Knee pain        Objective:   Physical Exam  Constitutional:       Appearance: He is well-developed.   HENT:      Head: Normocephalic.   Eyes:      Pupils: Pupils are equal, round, and reactive to light.   Neck:      Musculoskeletal: Normal range of motion.   Pulmonary:      Effort: Pulmonary effort is normal.   Musculoskeletal: Normal range of motion.         General: Swelling and tenderness present. No deformity.      Right hip: Normal.      Left hip: Normal.      Right knee: He exhibits normal range of motion, no swelling, no effusion, no ecchymosis, no deformity, no laceration, no erythema, normal alignment, no LCL laxity, normal patellar mobility, no bony tenderness and no MCL laxity. No tenderness found. No medial joint line, no lateral joint line, no MCL, no LCL and no patellar tendon tenderness noted.      Left knee: He exhibits swelling and bony tenderness. He exhibits normal range of motion, no effusion, no ecchymosis, no deformity, no laceration, no erythema, normal alignment, no LCL laxity, normal patellar mobility and no MCL laxity. Tenderness found. Medial joint line, lateral joint line and  patellar tendon tenderness noted. No MCL and no LCL tenderness noted.   Skin:     General: Skin is warm and dry.      Capillary Refill: Capillary refill takes less than 2 seconds.      Coloration: Skin is not pale.      Findings: No erythema or rash.   Neurological:      Mental Status: He is alert and oriented to person, place, and time.     Left knee-Skin intact with no erythema, ecchymosis or lacerations present.  Multiple well-healed surgical scars, no redness, no signs of infection.  0-140  Moderate laxity with anterior Lachman's test    Xr Knee Left (3 Views)    Result Date: 01/21/2019  XRAY X-ray 3 views of the left knee obtained and reviewed by me today in the office demonstrates age appropriate bone density throughout with severe degenerative changes with medial joint space collapse, moderate lateral  and patellofemoral joint space narrowing, moderate osteophyte formation in all 3 compartments as well as posterior, there is a radiopaque interference screw in the distal femur from prior ACL reconstruction, normal tracking of the patella, no acute osseous abnormalities. Impression: Severe degenerative changes of the left knee as above with no acute process.       Assessment:      Left knee DJD, severe  Left knee multiple ACL reconstructive surgeries.      Plan:      I discussed with him today his x-ray findings.  I explained to him that he does have severe arthritis in the left knee and his third reconstructed ACL has ruptured.  I had a lengthy discussion with him today in regards to all of his options.  I explained to him that we could proceed with cortisone injections every 3 months, could consider Orthovisc injections every 6 months and the last resort would be to consider knee replacement surgery.  I also discussed proceeding with a medial unloader brace.  After explaining all options with him today he would like to proceed with conservative treatment at this point but he does not want to consider a cortisone injection.  I also recommend that he continue working on diet and exercise to help with weight loss.  He would like to proceed with a medial unloader brace as well as Orthovisc injections.  I will get him set up for the brace and the injections.  Continue weight-bearing as tolerated.  Continue range of motion exercises as instructed.  Ice and elevate as needed.  Tylenol or Motrin for pain.  Follow up after insurance approval to proceed with Orthovisc injections for the left knee.        [place], DO

## 2019-01-21 NOTE — Patient Instructions (Signed)
Work on ROM and strengthening of the knee and legs   Weightbearing and activities as tolerated   Office will submit for GEL injection approval   [employer] rep will contact you for medial un-loader brace fitting   OTC NSAIDS and Tylenol as needed for pain, if tolerated   Follow up once gel injections are approved

## 2019-01-21 NOTE — Telephone Encounter (Signed)
Please submit for approval for left knee gel injections- Dr Gareth Morgan states to make sure to note every single prior treatment tried in the past (see office notes) is noted to insurance to assure approval, if still denied to submit for a free set or he will do a peer to peer if anything.

## 2019-01-23 NOTE — Telephone Encounter (Signed)
There is no PA needed for gel injection. They pay 90 % after the deductible is met. There is still 3,000 left and 9,000 out of pocket that needs to be reached.  PLEASE ORDER ORTHOVISC FOR THE LEFT KNEE

## 2019-01-27 ENCOUNTER — Encounter: Attending: Orthopaedic Surgery | Primary: Family Medicine

## 2019-01-27 NOTE — Telephone Encounter (Signed)
Patient was scheduled

## 2019-01-27 NOTE — Telephone Encounter (Signed)
Medication in the office please schedule

## 2019-01-28 ENCOUNTER — Ambulatory Visit
Admit: 2019-01-28 | Discharge: 2019-01-28 | Payer: PRIVATE HEALTH INSURANCE | Attending: Orthopaedic Surgery | Primary: Family Medicine

## 2019-01-28 DIAGNOSIS — M1712 Unilateral primary osteoarthritis, left knee: Secondary | ICD-10-CM

## 2019-01-28 NOTE — Progress Notes (Signed)
Patient here for a follow up on left knee pain and for ORTHOVISC #1 gel injection. He has been fitted for his medial un-loader brace and is just waiting for the brace to arrive.    Provider needs to conduct a hands on physical today due to patients injury/diagnosis

## 2019-01-28 NOTE — Patient Instructions (Signed)
ORTHOVISC #1 injection   Rest left knee today   Work on ROM and strengthening of knees and legs  OTC NSAIDS and Tylenol as needed for pain, if tolerated   follow up as scheduled next week for injection #2

## 2019-01-29 NOTE — Progress Notes (Signed)
Subjective:      Patient ID: Scott Jacobs is a 43 y.o. male.    Patient here for a follow up on left knee pain and for ORTHOVISC #1 gel injection. He has been fitted for his medial un-loader brace and is just waiting for the brace to arrive.    Provider needs to conduct a hands on physical today due to patients injury/diagnosis    He comes in today for his first Orthovisc injection for his left knee.  He states that overall he continues to deal with a grinding, deep and throbbing pain globally in his left knee.  He also continues to have intermittent instability and intermittent sharp and stabbing pain in the left knee.  Patient denies any new injury to the involved extremity/ joint, denies numbness or tingling in the involved extremity and denies fever or chills.        Knee Pain    Pertinent negatives include no numbness.       Review of Systems   Constitutional: Negative for activity change, chills and fever.   Respiratory: Negative for chest tightness.    Cardiovascular: Negative for chest pain.   Musculoskeletal: Positive for arthralgias, gait problem, joint swelling and myalgias. Negative for back pain.   Skin: Negative for color change, pallor, rash and wound.   Neurological: Negative for weakness and numbness.       Past Medical History:   Diagnosis Date   ??? Hypothyroidism    ??? Knee pain        Objective:   Physical Exam  Constitutional:       Appearance: He is well-developed.   HENT:      Head: Normocephalic.   Eyes:      Pupils: Pupils are equal, round, and reactive to light.   Neck:      Musculoskeletal: Normal range of motion.   Pulmonary:      Effort: Pulmonary effort is normal.   Musculoskeletal: Normal range of motion.         General: Swelling and tenderness present. No deformity.      Right hip: Normal.      Left hip: Normal.      Right knee: He exhibits normal range of motion, no swelling, no effusion, no ecchymosis, no deformity, no laceration, no erythema, normal alignment, no LCL  laxity, normal patellar mobility, no bony tenderness and no MCL laxity. No tenderness found. No medial joint line, no lateral joint line, no MCL, no LCL and no patellar tendon tenderness noted.      Left knee: He exhibits swelling and bony tenderness. He exhibits normal range of motion, no effusion, no ecchymosis, no deformity, no laceration, no erythema, normal alignment, no LCL laxity, normal patellar mobility and no MCL laxity. Tenderness found. Medial joint line, lateral joint line and patellar tendon tenderness noted. No MCL and no LCL tenderness noted.   Skin:     General: Skin is warm and dry.      Capillary Refill: Capillary refill takes less than 2 seconds.      Coloration: Skin is not pale.      Findings: No erythema or rash.   Neurological:      Mental Status: He is alert and oriented to person, place, and time.     Left knee-Skin intact with no erythema, ecchymosis or lacerations present.  Multiple well-healed surgical scars, no redness, no signs of infection.  0-140  Moderate laxity with anterior Lachman's test    Xr  Knee Left (3 Views)    Result Date: 01/21/2019  XRAY X-ray 3 views of the left knee reviewed by me today in the office demonstrates age appropriate bone density throughout with severe degenerative changes with medial joint space collapse, moderate lateral and patellofemoral joint space narrowing, moderate osteophyte formation in all 3 compartments as well as posterior, there is a radiopaque interference screw in the distal femur from prior ACL reconstruction, normal tracking of the patella, no acute osseous abnormalities. Impression: Severe degenerative changes of the left knee as above with no acute process.       Assessment:      Left knee DJD, severe  Left knee multiple ACL reconstructive surgeries.      Plan:       I discussed with him today the mechanics of the Orthovisc injections.  I did perform his first Orthovisc injection for his left knee today.  I explained to him that we can  repeat these injections every 6 months if needed.  The last resort would be to consider knee replacement surgery.  Continue weight-bearing as tolerated.  Continue range of motion exercises as instructed.  Ice and elevate as needed.  Tylenol or Motrin for pain.  Follow up in 1 week for second injection.    Knee Injection Procedure Note     Pre-operative Diagnosis: Left knee DJD     Post-operative Diagnosis: same     Indications: Symptom relief from osteoarthritis     Anesthesia: not required     Procedure Details   Verbal consent was obtained for the procedure. The joint was prepped with alcohol. A 22 gauge needle was inserted into the anterior aspect of the joint from a lateral approach. Orthovisc syringe injected. The needle was removed and the area cleansed and dressed.     Complications: None; patient tolerated the procedure well.            Maalik Pinn, DO

## 2019-02-04 ENCOUNTER — Ambulatory Visit
Admit: 2019-02-04 | Discharge: 2019-02-04 | Payer: PRIVATE HEALTH INSURANCE | Attending: Orthopaedic Surgery | Primary: Family Medicine

## 2019-02-04 DIAGNOSIS — M1712 Unilateral primary osteoarthritis, left knee: Secondary | ICD-10-CM

## 2019-02-04 NOTE — Progress Notes (Signed)
Patient presents in office for his second Orthovisc injection. Patient continues to have stiffness within the left knee and did feel relief after the first injection was given, but relief only lasted until the end of the week before symptoms returned to where they were prior to administration of injection. Pain is rated at a 5/10 today. No new injury or worsening of symptoms reported.

## 2019-02-04 NOTE — Patient Instructions (Signed)
Orthovisc # 2  Rest the left knee for 24-48 hours  Work on ROM and strengthening of the left knee   Weightbearing and activities as tolerated  May take Ibuprofen as needed for pain  Rest, ice, and elevate as needed  Follow up next week for Orthovisc # 2

## 2019-02-05 NOTE — Progress Notes (Signed)
Subjective:      Patient ID: Scott Jacobs is a 43 y.o. male.    Patient presents in office for his second Orthovisc injection. Patient continues to have stiffness within the left knee and did feel relief after the first injection was given, but relief only lasted until the end of the week before symptoms returned to where they were prior to administration of injection. Pain is rated at a 5/10 today. No new injury or worsening of symptoms reported.     He comes in today for his second Orthovisc injection for his left knee.  He states that he only noticed some mild improvement in his left knee pain for the first day after the first injection.  He states that overall he continues to deal with a grinding, deep and throbbing pain globally in his left knee.  He also continues to have intermittent instability and intermittent sharp and stabbing pain in the left knee.  Patient denies any new injury to the involved extremity/ joint, denies numbness or tingling in the involved extremity and denies fever or chills.        Knee Pain    Pertinent negatives include no numbness.       Review of Systems   Constitutional: Negative for activity change, chills and fever.   Respiratory: Negative for chest tightness.    Cardiovascular: Negative for chest pain.   Musculoskeletal: Positive for arthralgias, gait problem, joint swelling and myalgias. Negative for back pain.   Skin: Negative for color change, pallor, rash and wound.   Neurological: Negative for weakness and numbness.       Past Medical History:   Diagnosis Date   ??? Hypothyroidism    ??? Knee pain        Objective:   Physical Exam  Constitutional:       Appearance: He is well-developed.   HENT:      Head: Normocephalic.   Eyes:      Pupils: Pupils are equal, round, and reactive to light.   Neck:      Musculoskeletal: Normal range of motion.   Pulmonary:      Effort: Pulmonary effort is normal.   Musculoskeletal: Normal range of motion.         General: Swelling and  tenderness present. No deformity.      Right hip: Normal.      Left hip: Normal.      Right knee: He exhibits normal range of motion, no swelling, no effusion, no ecchymosis, no deformity, no laceration, no erythema, normal alignment, no LCL laxity, normal patellar mobility, no bony tenderness and no MCL laxity. No tenderness found. No medial joint line, no lateral joint line, no MCL, no LCL and no patellar tendon tenderness noted.      Left knee: He exhibits swelling and bony tenderness. He exhibits normal range of motion, no effusion, no ecchymosis, no deformity, no laceration, no erythema, normal alignment, no LCL laxity, normal patellar mobility and no MCL laxity. Tenderness found. Medial joint line, lateral joint line and patellar tendon tenderness noted. No MCL and no LCL tenderness noted.   Skin:     General: Skin is warm and dry.      Capillary Refill: Capillary refill takes less than 2 seconds.      Coloration: Skin is not pale.      Findings: No erythema or rash.   Neurological:      Mental Status: He is alert and oriented to person, place, and  time.     Left knee-Skin intact with no erythema, ecchymosis or lacerations present.  Multiple well-healed surgical scars, no redness, no signs of infection.  0-140  Moderate laxity with anterior Lachman's test    Xr Knee Left (3 Views)    Result Date: 01/21/2019  XRAY X-ray 3 views of the left knee reviewed by me today in the office demonstrates age appropriate bone density throughout with severe degenerative changes with medial joint space collapse, moderate lateral and patellofemoral joint space narrowing, moderate osteophyte formation in all 3 compartments as well as posterior, there is a radiopaque interference screw in the distal femur from prior ACL reconstruction, normal tracking of the patella, no acute osseous abnormalities. Impression: Severe degenerative changes of the left knee as above with no acute process.       Assessment:      Left knee DJD,  severe  Left knee multiple ACL reconstructive surgeries.      Plan:       I discussed with him today his response to the first Orthovisc injection for his left knee.  I did perform his second Orthovisc injection for his left knee today.  I explained to him that we can repeat these injections every 6 months if needed.  The last resort would be to consider knee replacement surgery.  Continue weight-bearing as tolerated.  Continue range of motion exercises as instructed.  Ice and elevate as needed.  Tylenol or Motrin for pain.  Follow up in 1 week for third injection.    Knee Injection Procedure Note     Pre-operative Diagnosis: Left knee DJD     Post-operative Diagnosis: same     Indications: Symptom relief from osteoarthritis     Anesthesia: not required     Procedure Details   Verbal consent was obtained for the procedure. The joint was prepped with alcohol. A 22 gauge needle was inserted into the anterior aspect of the joint from a lateral approach. Orthovisc syringe injected. The needle was removed and the area cleansed and dressed.     Complications: None; patient tolerated the procedure well.            Roque Schill, DO

## 2019-02-11 ENCOUNTER — Ambulatory Visit
Admit: 2019-02-11 | Discharge: 2019-02-11 | Payer: PRIVATE HEALTH INSURANCE | Attending: Orthopaedic Surgery | Primary: Family Medicine

## 2019-02-11 DIAGNOSIS — M1712 Unilateral primary osteoarthritis, left knee: Secondary | ICD-10-CM

## 2019-02-11 NOTE — Progress Notes (Signed)
Subjective:      Patient ID: Scott Jacobs is a 43 y.o. male.      He comes in today for his third Orthovisc injection for his left knee.  He states that he only noticed some mild improvement in his left knee pain since the second injection.  He states that overall he continues to deal with a grinding, deep and throbbing pain globally in his left knee.  He also continues to have intermittent instability and intermittent sharp and stabbing pain in the left knee.  Patient denies any new injury to the involved extremity/ joint, denies numbness or tingling in the involved extremity and denies fever or chills.    He has received his medial unloader brace but has not received instruction on how to tension it.        Knee Pain    Pertinent negatives include no numbness.       Review of Systems   Constitutional: Negative for activity change, chills and fever.   Respiratory: Negative for chest tightness.    Cardiovascular: Negative for chest pain.   Musculoskeletal: Positive for arthralgias, gait problem, joint swelling and myalgias. Negative for back pain.   Skin: Negative for color change, pallor, rash and wound.   Neurological: Negative for weakness and numbness.       Past Medical History:   Diagnosis Date   ??? Hypothyroidism    ??? Knee pain        Objective:   Physical Exam  Constitutional:       Appearance: He is well-developed.   HENT:      Head: Normocephalic.   Eyes:      Pupils: Pupils are equal, round, and reactive to light.   Neck:      Musculoskeletal: Normal range of motion.   Pulmonary:      Effort: Pulmonary effort is normal.   Musculoskeletal: Normal range of motion.         General: Swelling and tenderness present. No deformity.      Right hip: Normal.      Left hip: Normal.      Right knee: He exhibits normal range of motion, no swelling, no effusion, no ecchymosis, no deformity, no laceration, no erythema, normal alignment, no LCL laxity, normal patellar mobility, no bony tenderness and no MCL  laxity. No tenderness found. No medial joint line, no lateral joint line, no MCL, no LCL and no patellar tendon tenderness noted.      Left knee: He exhibits swelling and bony tenderness. He exhibits normal range of motion, no effusion, no ecchymosis, no deformity, no laceration, no erythema, normal alignment, no LCL laxity, normal patellar mobility and no MCL laxity. Tenderness found. Medial joint line, lateral joint line and patellar tendon tenderness noted. No MCL and no LCL tenderness noted.   Skin:     General: Skin is warm and dry.      Capillary Refill: Capillary refill takes less than 2 seconds.      Coloration: Skin is not pale.      Findings: No erythema or rash.   Neurological:      Mental Status: He is alert and oriented to person, place, and time.     Left knee-Skin intact with no erythema, ecchymosis or lacerations present.  Multiple well-healed surgical scars, no redness, no signs of infection.  0-140  Moderate laxity with anterior Lachman's test    Xr Knee Left (3 Views)    Result Date: 01/21/2019  XRAY  X-ray 3 views of the left knee reviewed by me today in the office demonstrates age appropriate bone density throughout with severe degenerative changes with medial joint space collapse, moderate lateral and patellofemoral joint space narrowing, moderate osteophyte formation in all 3 compartments as well as posterior, there is a radiopaque interference screw in the distal femur from prior ACL reconstruction, normal tracking of the patella, no acute osseous abnormalities. Impression: Severe degenerative changes of the left knee as above with no acute process.       Assessment:      Left knee DJD, severe  Left knee multiple ACL reconstructive surgeries.      Plan:       I discussed with him today his response to the second Orthovisc injection for his left knee.  I did perform his third Orthovisc injection for his left knee today.  I explained to him that we can repeat these injections every 6 months if  needed.  The last resort would be to consider knee replacement surgery.  Continue weight-bearing as tolerated.  Continue range of motion exercises as instructed.  Ice and elevate as needed.  Tylenol or Motrin for pain.  Follow up in 6 weeks for recheck.      Knee Injection Procedure Note     Pre-operative Diagnosis: Left knee DJD     Post-operative Diagnosis: same     Indications: Symptom relief from osteoarthritis     Anesthesia: not required     Procedure Details   Verbal consent was obtained for the procedure. The joint was prepped with alcohol. A 22 gauge needle was inserted into the anterior aspect of the joint from a lateral approach. Orthovisc syringe injected. The needle was removed and the area cleansed and dressed.     Complications: None; patient tolerated the procedure well.            Julian, DO

## 2019-02-11 NOTE — Patient Instructions (Signed)
Continue weight-bearing as tolerated.  Continue range of motion exercises as instructed.  Ice and elevate as needed.  Tylenol or Motrin for pain.  #3 gel injection given today  Follow up as needed

## 2019-03-25 ENCOUNTER — Ambulatory Visit
Admit: 2019-03-25 | Discharge: 2019-03-25 | Payer: PRIVATE HEALTH INSURANCE | Attending: Orthopaedic Surgery | Primary: Family Medicine

## 2019-03-25 DIAGNOSIS — M1712 Unilateral primary osteoarthritis, left knee: Secondary | ICD-10-CM

## 2019-03-26 ENCOUNTER — Ambulatory Visit
Admit: 2019-03-26 | Discharge: 2019-03-26 | Payer: PRIVATE HEALTH INSURANCE | Attending: Family Medicine | Primary: Family Medicine

## 2019-03-26 ENCOUNTER — Encounter

## 2019-03-26 DIAGNOSIS — G8929 Other chronic pain: Secondary | ICD-10-CM

## 2019-03-26 DIAGNOSIS — M25562 Pain in left knee: Secondary | ICD-10-CM

## 2019-03-26 MED ORDER — IBUPROFEN 800 MG PO TABS
800 MG | ORAL_TABLET | ORAL | 3 refills | Status: AC
Start: 2019-03-26 — End: ?

## 2019-03-26 MED ORDER — HYDROCODONE-ACETAMINOPHEN 7.5-325 MG PO TABS
ORAL_TABLET | Freq: Four times a day (QID) | ORAL | 0 refills | Status: AC | PRN
Start: 2019-03-26 — End: 2019-04-25

## 2019-03-27 LAB — VITAMIN B12: Vitamin B-12: 664 pg/mL (ref 211–911)

## 2019-03-27 LAB — MAGNESIUM: Magnesium: 2 mg/dL (ref 1.80–2.40)

## 2019-03-27 LAB — COMPREHENSIVE METABOLIC PANEL
ALT: 51 U/L — ABNORMAL HIGH (ref 10–40)
AST: 34 U/L (ref 15–37)
Albumin/Globulin Ratio: 1.6 (ref 1.1–2.2)
Albumin: 4.7 g/dL (ref 3.4–5.0)
Alkaline Phosphatase: 116 U/L (ref 40–129)
Anion Gap: 13 (ref 3–16)
BUN: 15 mg/dL (ref 7–20)
CO2: 25 mmol/L (ref 21–32)
Calcium: 9.4 mg/dL (ref 8.3–10.6)
Chloride: 103 mmol/L (ref 99–110)
Creatinine: 0.5 mg/dL — ABNORMAL LOW (ref 0.9–1.3)
GFR African American: >60 (ref >60)
GFR Non-African American: >60 (ref >60)
Globulin: 2.9 g/dL
Glucose: 87 mg/dL (ref 70–99)
Potassium: 4 mmol/L (ref 3.5–5.1)
Sodium: 141 mmol/L (ref 136–145)
Total Bilirubin: 0.5 mg/dL (ref 0.0–1.0)
Total Protein: 7.6 g/dL (ref 6.4–8.2)

## 2019-03-27 LAB — HEMOGLOBIN A1C
Estimated Avg Glucose: 105.4 mg/dL
Hemoglobin A1C: 5.3 %

## 2019-03-27 LAB — CBC WITH AUTO DIFFERENTIAL
Basophils %: 0.4 %
Basophils Absolute: 0 10*3/uL (ref 0.0–0.2)
Eosinophils %: 1.4 %
Eosinophils Absolute: 0.2 10*3/uL (ref 0.0–0.6)
Hematocrit: 45.1 % (ref 40.5–52.5)
Hemoglobin: 15.4 g/dL (ref 13.5–17.5)
Lymphocytes %: 25.9 %
Lymphocytes Absolute: 2.8 10*3/uL (ref 1.0–5.1)
MCH: 29.6 pg (ref 26.0–34.0)
MCHC: 34.1 g/dL (ref 31.0–36.0)
MCV: 86.6 fL (ref 80.0–100.0)
MPV: 9.8 fL (ref 5.0–10.5)
Monocytes %: 5.8 %
Monocytes Absolute: 0.6 10*3/uL (ref 0.0–1.3)
Neutrophils %: 66.5 %
Neutrophils Absolute: 7.2 10*3/uL (ref 1.7–7.7)
Platelets: 176 10*3/uL (ref 135–450)
RBC: 5.2 M/uL (ref 4.20–5.90)
RDW: 14.8 % (ref 12.4–15.4)
WBC: 10.8 10*3/uL (ref 4.0–11.0)

## 2019-03-27 LAB — VITAMIN D 25 HYDROXY: Vit D, 25-Hydroxy: 24.1 ng/mL — ABNORMAL LOW (ref >=30)

## 2019-03-27 LAB — TSH WITH REFLEX TO FT4: TSH Reflex FT4: 0.67 u[IU]/mL (ref 0.27–4.20)

## 2019-06-24 ENCOUNTER — Ambulatory Visit
Admit: 2019-06-24 | Discharge: 2019-06-24 | Payer: PRIVATE HEALTH INSURANCE | Attending: Orthopaedic Surgery | Primary: Family Medicine

## 2019-06-24 DIAGNOSIS — M1712 Unilateral primary osteoarthritis, left knee: Secondary | ICD-10-CM

## 2019-06-26 ENCOUNTER — Encounter: Attending: Family Medicine | Primary: Family Medicine

## 2019-06-30 NOTE — Telephone Encounter (Signed)
Left message  Pre testing is 07/08/19 at 1 pm at [facility]

## 2019-07-02 ENCOUNTER — Encounter: Payer: PRIVATE HEALTH INSURANCE | Attending: Family Medicine | Primary: Family Medicine

## 2019-07-08 ENCOUNTER — Inpatient Hospital Stay: Admit: 2019-07-08 | Payer: PRIVATE HEALTH INSURANCE | Primary: Family Medicine

## 2019-07-08 ENCOUNTER — Encounter

## 2019-07-08 DIAGNOSIS — M1712 Unilateral primary osteoarthritis, left knee: Secondary | ICD-10-CM

## 2019-07-08 DIAGNOSIS — Z01812 Encounter for preprocedural laboratory examination: Secondary | ICD-10-CM

## 2019-07-08 LAB — COMPREHENSIVE METABOLIC PANEL
ALT: 37 U/L (ref 10–40)
AST: 24 IU/L (ref 15–37)
Albumin: 4.6 GM/DL (ref 3.4–5.0)
Alkaline Phosphatase: 106 IU/L (ref 40–128)
Anion Gap: 14 (ref 4–16)
BUN: 13 MG/DL (ref 6–23)
CO2: 26 MMOL/L (ref 21–32)
Calcium: 9.5 MG/DL (ref 8.3–10.6)
Chloride: 101 mMol/L (ref 99–110)
Creatinine: 0.8 MG/DL — ABNORMAL LOW (ref 0.9–1.3)
GFR African American: >60 mL/min/{1.73_m2} (ref >60)
GFR Non-African American: >60 mL/min/{1.73_m2} (ref >60)
Glucose: 107 MG/DL — ABNORMAL HIGH (ref 70–99)
Potassium: 4.1 MMOL/L (ref 3.5–5.1)
Sodium: 141 MMOL/L (ref 135–145)
Total Bilirubin: 0.5 MG/DL (ref 0.0–1.0)
Total Protein: 7.9 GM/DL (ref 6.4–8.2)

## 2019-07-08 LAB — CBC WITH AUTO DIFFERENTIAL
Basophils %: 0.6 % (ref 0–1)
Basophils Absolute: 0.1 10*3/uL
Eosinophils %: 1 % (ref 0–3)
Eosinophils Absolute: 0.1 10*3/uL
Hematocrit: 49.6 % (ref 42–52)
Hemoglobin: 17.4 GM/DL (ref 13.5–18.0)
Immature Neutrophil %: 0.5 % — ABNORMAL HIGH (ref 0–0.43)
Lymphocytes %: 25.4 % (ref 24–44)
Lymphocytes Absolute: 2.7 10*3/uL
MCH: 30.4 PG (ref 27–31)
MCHC: 35.1 % (ref 32.0–36.0)
MCV: 86.7 FL (ref 78–100)
MPV: 11.2 FL — ABNORMAL HIGH (ref 7.5–11.1)
Monocytes %: 6.8 % — ABNORMAL HIGH (ref 0–4)
Monocytes Absolute: 0.7 10*3/uL
Nucleated RBC %: 0 %
Platelets: 216 10*3/uL (ref 140–440)
RBC: 5.72 10*6/uL (ref 4.6–6.2)
RDW: 13.6 % (ref 11.7–14.9)
Segs Absolute: 7.1 10*3/uL
Segs Relative: 65.7 % (ref 36–66)
Total Immature Neutrophil: 0.05 10*3/uL
Total Nucleated RBC: 0 10*3/uL
WBC: 10.8 10*3/uL — ABNORMAL HIGH (ref 4.0–10.5)

## 2019-07-08 LAB — URINALYSIS
Bacteria, UA: NEGATIVE /HPF
Bilirubin Urine: NEGATIVE MG/DL
Blood, Urine: NEGATIVE
Glucose, Urine: NEGATIVE MG/DL
Ketones, Urine: NEGATIVE MG/DL
Leukocyte Esterase, Urine: NEGATIVE
Nitrite Urine, Quantitative: NEGATIVE
Protein, UA: NEGATIVE MG/DL
RBC, UA: NONE SEEN /HPF (ref 0–3)
Specific Gravity, UA: 1.014 (ref 1.001–1.035)
Trichomonas, UA: NONE SEEN /HPF
Urobilinogen, Urine: NEGATIVE MG/DL (ref 0.2–1.0)
WBC, UA: 1 /HPF (ref 0–2)
pH, Urine: 6 (ref 5.0–8.0)

## 2019-07-08 LAB — SEDIMENTATION RATE: Sed Rate: 9 MM/HR (ref 0–15)

## 2019-07-08 MED ORDER — OXYCODONE-ACETAMINOPHEN 5-325 MG PO TABS
5-325 MG | ORAL_TABLET | Freq: Four times a day (QID) | ORAL | 0 refills | Status: AC | PRN
Start: 2019-07-08 — End: 2019-07-15

## 2019-07-08 MED ORDER — CEPHALEXIN 250 MG PO CAPS
250 MG | ORAL_CAPSULE | Freq: Four times a day (QID) | ORAL | 0 refills | Status: AC
Start: 2019-07-08 — End: 2019-07-09

## 2019-07-08 MED ORDER — XARELTO 10 MG PO TABS
10 MG | ORAL_TABLET | Freq: Every day | ORAL | 0 refills | Status: DC
Start: 2019-07-08 — End: 2020-06-22

## 2019-07-08 NOTE — Plan of Care (Signed)
Outpatient Physical Therapy           [place]           _0  Phone: [phone]   Fax: [phone]  [place]           _1  Phone: [phone]   Fax: [phone]     To: Referring Practitioner: Adline Potter    From: Susa Simmonds, PT     Patient: Scott Jacobs       DOB: 1975/06/12  Diagnosis: Diagnosis: L knee OA   Treatment Diagnosis: Treatment Diagnosis: L knee pain   Date: 07/08/2019    Physical Therapy Certification/Re-Certification Form    The following patient has been evaluated for physical therapy services and for therapy to continue, insurance requires physician review of the treatment plan initially and every 90 days. Please review the attached evaluation and/or summary of the patient's plan of care, and verify that you agree therapy should continue by signing the attached document and sending it back to our office.    Assessment:       ASSESSMENT  Patient primary complaints: L knee pain  History of condition:To have L TKA SDS 07/16/19; no prior joint replacements- has had 3L  knee surgeries in the past - has ambulated with crutches  Current functional limitations: l knee pain with kneeling/twisting/squatting  Clinical findings:L knee AROM limited with EXT/FLEX  PLOF:employed @ [employer]  Skilled PT interventions are intended to:  Prepare patient for post op exercise and mobility  Patient agrees with established plan of care and assisted in the development of their  goals  Barriers to learning:none- no mental/cognitive barriers observed  Preferred learning style(s):   written- demonstration -practice  Preferred Language: English  Potential barriers to progress:none  The patient appears motivated to participate in PT and regain PLOF: yes  Plan of Care/Treatment to date:  _2  Therapeutic Exercise  _3  Modalities:  _4  Therapeutic Activity     _5  Ultrasound  _6  Electrical Stimulation  _7  Gait Training      _8  Cervical Traction _9  Lumbar Traction  _10  Neuromuscular Re-education    _11  Cold/hotpack _12   Iontophoresis   _13  Instruction in HEP      _14  Vasopneumatic    _15  Dry Needling  _16  Manual Therapy               _17  Aquatic Therapy       Other:          Frequency/Duration:  # Days per week: _18  1 day # Weeks: _19  1 week _20  5 weeks     _21  2 days   _22  2 weeks _23  6 weeks     _24  3 days   _25  3 weeks _26  7 weeks     _27  4 days   _28  4 weeks _29  8 weeks         _30  9 weeks _31  10 weeks         _32  11 weeks _33  12 weeks    Rehab Potential/Progress: _34  Excellent _35  Good _36  Fair  _37  Poor     Goals:  Pre op goals met    Short term goal 1: Patient will verbalize  and demonstrate understanding of initial post op phase exercises  Short term goal 2 : Patient will demonstrate understanding of proper use of walker with WBAT on affected limb  Short term goal 3: Patient will demonstrate understanding of proper sequence for going up/down steps to limit stress on the affected limb  Short term  goal 4: Patient will watch [facility] joint camp video         Electronically signed by:  Susa Simmonds, PT, 07/08/2019, 11:28 AM        If you have any questions or concerns, please don't hesitate to call.  Thank you for your referral.      Physician Signature:________________________________Date:_________ TIME: _____  By signing above, therapist???s plan is approved by physician

## 2019-07-08 NOTE — Progress Notes (Signed)
07/07/19 - LM of pre-testing on 07/08/19 @1300 , arrival @1230 .   Bring insurance card, picture ID, a list of your home medications and wear a mask. 

## 2019-07-08 NOTE — Progress Notes (Signed)
Physical Therapy  Initial Assessment  Date: 07/08/2019  Patient Name: HALSTON KINTZ  MRN: [id number]  DOB: 31-Dec-1975     Treatment Diagnosis: L knee pain    Restrictions  Position Activity Restriction  Other position/activity restrictions: none    Subjective   General  Chart Reviewed: Yes  Patient assessed for rehabilitation services?: Yes  Additional Pertinent Hx: L knee: 2 ACL surgeries, 1 follow up arhroscopy 2019  Family / Caregiver Present: No  Referring Practitioner: Adline Potter  Diagnosis: L knee OA  Follows Commands: Within Functional Limits  PT Visit Information  PT Insurance Information: [employer]  Subjective  Subjective: To have L TKA SDS 07/16/19; no prior joint replacements- has had 3L  knee surgeries in the past - has ambulated with crutches  Pain Screening  Patient Currently in Pain: Yes  Pain Assessment  Pain Assessment: 0-10  Pain Level: 0(0/10 @ rest- 8/10 with kneeling/ squatting/ turning)  Pain Type: Chronic pain  Pain Location: Knee  Pain Orientation: Left  Pain Frequency: Intermittent  Clinical Progression: Gradually worsening  Functional Pain Assessment: Prevents or interferes with all active and some passive activities  Vital Signs  Patient Currently in Pain: Yes    Vision/Hearing  Vision  Vision: Within Functional Limits  Hearing  Hearing: Within functional limits    Orientation  Orientation  Overall Orientation Status: Within Normal Limits    Social/Functional History  Social/Functional History  Lives With: Family  Home Layout: Performs ADL's on one level  Home Access: Stairs to enter without rails  Entrance Stairs - Number of Steps: 2  Bathroom Shower/Tub: Tub/Shower unit  Home Equipment: Crutches  ADL Assistance: Independent  Homemaking Assistance: Independent  Ambulation Assistance: Independent  Transfer Assistance: Independent  Active Driver: Yes  Mode of Transportation: Car  Occupation: Full time employment  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  Additional Comments: ellipitical/  universal gym @ home    Objective     Observation/Palpation  Posture: Good    AROM RLE (degrees)  RLE General AROM: R knee AROM WFL  AROM LLE (degrees)  LLE General AROM: L knee AROM 95* initally then 112* after ROM exercise- knee EXT ROM -5*    Strength LLE  Strength LLE: Exception  L Knee Extension: At least;3+/5                      Ambulation  Ambulation?: Yes  Ambulation 1  Surface: carpet  Device: No Device  Assistance: Independent  Gait Deviations: Slow Cadence                            Assessment   Conditions Requiring Skilled Therapeutic Intervention  Body structures, Functions, Activity limitations: Increased pain;Decreased ROM  Treatment Diagnosis: L knee pain  Prognosis: Good  Decision Making: Medium Complexity  History: PF- Hx of multiple L knee surgeries  Exam: L knee ROM.  Clinical Presentation: changing characteriistics of function due to pain  Barriers to Learning: none  REQUIRES PT FOLLOW UP: Yes         Plan        G-Code       OutComes Score  AM-PAC Score             Goals          Therapy Time   Individual Concurrent Group Co-treatment   Time In 1005         Time Out 1047         Minutes 42         Timed Code Treatment Minutes: 15 Minutes       Pranish Akhavan Delphina Cahill, PT

## 2019-07-09 LAB — CULTURE, URINE: Culture: NO GROWTH

## 2019-07-09 LAB — COVID-19: SARS-CoV-2: NOT DETECTED

## 2019-07-16 ENCOUNTER — Inpatient Hospital Stay: Payer: PRIVATE HEALTH INSURANCE

## 2019-07-16 ENCOUNTER — Ambulatory Visit: Admit: 2019-07-16 | Payer: PRIVATE HEALTH INSURANCE | Primary: Family Medicine

## 2019-07-16 MED ORDER — MAGNESIUM HYDROXIDE 400 MG/5ML PO SUSP
400 MG/5ML | Freq: Every day | ORAL | Status: DC | PRN
Start: 2019-07-16 — End: 2019-07-16

## 2019-07-16 MED ORDER — NORMAL SALINE FLUSH 0.9 % IV SOLN
0.9 % | INTRAVENOUS | Status: DC | PRN
Start: 2019-07-16 — End: 2019-07-16

## 2019-07-16 MED ORDER — KETAMINE HCL 10 MG/ML IJ SOLN
10 MG/ML | INTRAMUSCULAR | Status: DC | PRN
Start: 2019-07-16 — End: 2019-07-16
  Administered 2019-07-16 (×5): 10 via INTRAVENOUS

## 2019-07-16 MED ORDER — CEFAZOLIN SODIUM 1 G IJ SOLR
1 | INTRAMUSCULAR | Status: DC
Start: 2019-07-16 — End: 2019-07-16

## 2019-07-16 MED ORDER — NORMAL SALINE FLUSH 0.9 % IV SOLN
0.9 % | Freq: Two times a day (BID) | INTRAVENOUS | Status: DC
Start: 2019-07-16 — End: 2019-07-16

## 2019-07-16 MED ORDER — TRANEXAMIC ACID 1000 MG/10ML IV SOLN
1000 | INTRAVENOUS | Status: AC
Start: 2019-07-16 — End: 2019-07-16

## 2019-07-16 MED ORDER — SENNA-DOCUSATE SODIUM 8.6-50 MG PO TABS
Freq: Two times a day (BID) | ORAL | Status: DC
Start: 2019-07-16 — End: 2019-07-16

## 2019-07-16 MED ORDER — ONDANSETRON HCL 4 MG/2ML IJ SOLN
4 MG/2ML | Freq: Four times a day (QID) | INTRAMUSCULAR | Status: DC | PRN
Start: 2019-07-16 — End: 2019-07-16

## 2019-07-16 MED ORDER — PROPOFOL 1000 MG/100ML IV EMUL
1000 | INTRAVENOUS | Status: AC
Start: 2019-07-16 — End: 2019-07-16

## 2019-07-16 MED ORDER — LACTATED RINGERS IV SOLN
INTRAVENOUS | Status: DC
Start: 2019-07-16 — End: 2019-07-16
  Administered 2019-07-16: 13:00:00 1000 mL via INTRAVENOUS
  Administered 2019-07-16 (×2): via INTRAVENOUS

## 2019-07-16 MED ORDER — ACETAMINOPHEN 500 MG PO TABS
500 MG | Freq: Once | ORAL | Status: AC
Start: 2019-07-16 — End: 2019-07-16
  Administered 2019-07-16: 15:00:00 1000 mg via ORAL

## 2019-07-16 MED ORDER — ONDANSETRON HCL 4 MG/2ML IJ SOLN
4 | INTRAMUSCULAR | Status: AC
Start: 2019-07-16 — End: 2019-07-16

## 2019-07-16 MED ORDER — ONDANSETRON HCL 4 MG/2ML IJ SOLN
4 MG/2ML | INTRAMUSCULAR | Status: DC | PRN
Start: 2019-07-16 — End: 2019-07-16
  Administered 2019-07-16: 17:00:00 4 via INTRAVENOUS

## 2019-07-16 MED ORDER — CEFAZOLIN SODIUM-DEXTROSE 2-3 GM-%(50ML) IV SOLR
2000 mg in Dextrose 3% | INTRAVENOUS | Status: AC
Start: 2019-07-16 — End: 2019-07-16
  Administered 2019-07-16: 16:00:00 3000 mg via INTRAVENOUS

## 2019-07-16 MED ORDER — KETOROLAC TROMETHAMINE 30 MG/ML IJ SOLN
30 | INTRAMUSCULAR | Status: AC
Start: 2019-07-16 — End: 2019-07-16

## 2019-07-16 MED ORDER — RIVAROXABAN 10 MG PO TABS
10 MG | Freq: Every day | ORAL | Status: DC
Start: 2019-07-16 — End: 2019-07-16

## 2019-07-16 MED ORDER — SODIUM CHLORIDE 0.9 % IV SOLN
0.9 | INTRAVENOUS | Status: DC | PRN
Start: 2019-07-16 — End: 2019-07-16

## 2019-07-16 MED ORDER — LACTATED RINGERS IV SOLN
INTRAVENOUS | Status: DC
Start: 2019-07-16 — End: 2019-07-16

## 2019-07-16 MED ORDER — EPHEDRINE SULFATE (PRESSORS) 50 MG/ML IV SOLN
50 | INTRAVENOUS | Status: AC
Start: 2019-07-16 — End: 2019-07-16

## 2019-07-16 MED ORDER — PROPOFOL 200 MG/20ML IV EMUL
200 | INTRAVENOUS | Status: AC
Start: 2019-07-16 — End: 2019-07-16

## 2019-07-16 MED ORDER — PROPOFOL 200 MG/20ML IV EMUL
200 MG/20ML | INTRAVENOUS | Status: DC | PRN
Start: 2019-07-16 — End: 2019-07-16
  Administered 2019-07-16: 16:00:00 50 via INTRAVENOUS
  Administered 2019-07-16: 16:00:00 100 via INTRAVENOUS

## 2019-07-16 MED ORDER — MIDAZOLAM HCL 2 MG/2ML IJ SOLN
2 MG/ML | INTRAMUSCULAR | Status: DC | PRN
Start: 2019-07-16 — End: 2019-07-16
  Administered 2019-07-16: 16:00:00 2 via INTRAVENOUS

## 2019-07-16 MED ORDER — DEXAMETHASONE SODIUM PHOSPHATE 4 MG/ML IJ SOLN
4 MG/ML | INTRAMUSCULAR | Status: DC | PRN
Start: 2019-07-16 — End: 2019-07-16
  Administered 2019-07-16: 17:00:00 8 via INTRAVENOUS

## 2019-07-16 MED ORDER — EPHEDRINE SULFATE (PRESSORS) 50 MG/ML IV SOLN
50 MG/ML | INTRAVENOUS | Status: DC | PRN
Start: 2019-07-16 — End: 2019-07-16
  Administered 2019-07-16: 18:00:00 10 via INTRAVENOUS
  Administered 2019-07-16: 16:00:00 25 via INTRAVENOUS
  Administered 2019-07-16: 18:00:00 10 via INTRAVENOUS
  Administered 2019-07-16: 18:00:00 5 via INTRAVENOUS

## 2019-07-16 MED ORDER — BUPIVACAINE HCL (PF) 0.5 % IJ SOLN
0.5 | INTRAMUSCULAR | Status: AC
Start: 2019-07-16 — End: 2019-07-16

## 2019-07-16 MED ORDER — OXYCODONE HCL 5 MG PO TABS
5 MG | ORAL | Status: DC | PRN
Start: 2019-07-16 — End: 2019-07-16

## 2019-07-16 MED ORDER — ACETAMINOPHEN 325 MG PO TABS
325 MG | Freq: Four times a day (QID) | ORAL | Status: DC
Start: 2019-07-16 — End: 2019-07-16

## 2019-07-16 MED ORDER — KETOROLAC TROMETHAMINE 30 MG/ML IJ SOLN
30 MG/ML | INTRAMUSCULAR | Status: DC | PRN
Start: 2019-07-16 — End: 2019-07-16
  Administered 2019-07-16: 18:00:00 30 via INTRAVENOUS

## 2019-07-16 MED ORDER — LIDOCAINE HCL (CARDIAC) PF 100 MG/5ML IV SOLN
100 MG/5ML | INTRAVENOUS | Status: DC | PRN
Start: 2019-07-16 — End: 2019-07-16
  Administered 2019-07-16: 16:00:00 50 via INTRAVENOUS

## 2019-07-16 MED ORDER — LIDOCAINE HCL (CARDIAC) PF 100 MG/5ML IV SOLN
100 | INTRAVENOUS | Status: AC
Start: 2019-07-16 — End: 2019-07-16

## 2019-07-16 MED ORDER — TRANEXAMIC ACID 1000 MG/10ML IV SOLN
1000 MG/10ML | INTRAVENOUS | Status: DC | PRN
Start: 2019-07-16 — End: 2019-07-16
  Administered 2019-07-16 (×2): 1000 via INTRAVENOUS

## 2019-07-16 MED ORDER — OXYCODONE HCL 10 MG PO TABS
10 MG | ORAL | Status: DC | PRN
Start: 2019-07-16 — End: 2019-07-16

## 2019-07-16 MED ORDER — SEVOFLURANE IN SOLN
RESPIRATORY_TRACT | Status: AC
Start: 2019-07-16 — End: 2019-07-16

## 2019-07-16 MED ORDER — PROMETHAZINE HCL 12.5 MG PO TABS
12.5 MG | Freq: Four times a day (QID) | ORAL | Status: DC | PRN
Start: 2019-07-16 — End: 2019-07-16

## 2019-07-16 MED ORDER — ROPIVACAINE HCL 5 MG/ML IJ SOLN
INTRAMUSCULAR | Status: AC
Start: 2019-07-16 — End: 2019-07-16

## 2019-07-16 MED ORDER — DEXAMETHASONE SODIUM PHOSPHATE 4 MG/ML IJ SOLN
4 | INTRAMUSCULAR | Status: AC
Start: 2019-07-16 — End: 2019-07-16

## 2019-07-16 MED ORDER — MIDAZOLAM HCL 2 MG/2ML IJ SOLN
2 | INTRAMUSCULAR | Status: AC
Start: 2019-07-16 — End: 2019-07-16

## 2019-07-16 MED ORDER — GLYCOPYRROLATE 0.2 MG/ML IJ SOLN
0.2 MG/ML | INTRAMUSCULAR | Status: DC | PRN
Start: 2019-07-16 — End: 2019-07-16
  Administered 2019-07-16 (×2): 0.2 via INTRAVENOUS

## 2019-07-16 MED ORDER — GLYCOPYRROLATE 0.2 MG/ML IJ SOLN
0.2 | INTRAMUSCULAR | Status: AC
Start: 2019-07-16 — End: 2019-07-16

## 2019-07-16 MED ORDER — DEXTROSE 5 % IV SOLN
5 % | INTRAVENOUS | Status: DC
Start: 2019-07-16 — End: 2019-07-16

## 2019-07-16 MED ORDER — ROPIVACAINE HCL 5 MG/ML IJ SOLN
INTRAMUSCULAR | Status: AC
Start: 2019-07-16 — End: 2019-07-16
  Administered 2019-07-16: 16:00:00 30 via PERINEURAL

## 2019-07-16 MED ORDER — NORMAL SALINE FLUSH 0.9 % IV SOLN
0.9 % | INTRAVENOUS | Status: DC | PRN
Start: 2019-07-16 — End: 2019-07-16
  Administered 2019-07-16: 13:00:00 10 mL via INTRAVENOUS

## 2019-07-16 MED FILL — NORMAL SALINE FLUSH 0.9 % IV SOLN: 0.9 % | INTRAVENOUS | Qty: 10

## 2019-07-16 MED FILL — LIDOCAINE HCL (CARDIAC) PF 100 MG/5ML IV SOLN: 100 MG/5ML | INTRAVENOUS | Qty: 5

## 2019-07-16 MED FILL — CEFAZOLIN SODIUM 1 G IJ SOLR: 1 g | INTRAMUSCULAR | Qty: 1000

## 2019-07-16 MED FILL — KETOROLAC TROMETHAMINE 30 MG/ML IJ SOLN: 30 mg/mL | INTRAMUSCULAR | Qty: 1

## 2019-07-16 MED FILL — TYLENOL EXTRA STRENGTH 500 MG PO TABS: 500 mg | ORAL | Qty: 2

## 2019-07-16 MED FILL — SEVOFLURANE IN SOLN: RESPIRATORY_TRACT | Qty: 250

## 2019-07-16 MED FILL — GLYCOPYRROLATE 0.2 MG/ML IJ SOLN: 0.2 mg/mL | INTRAMUSCULAR | Qty: 2

## 2019-07-16 MED FILL — NAROPIN 5 MG/ML IJ SOLN: 5 mg/mL | INTRAMUSCULAR | Qty: 30

## 2019-07-16 MED FILL — TRANEXAMIC ACID 1000 MG/10ML IV SOLN: 1000 MG/10ML | INTRAVENOUS | Qty: 10

## 2019-07-16 MED FILL — ONDANSETRON HCL 4 MG/2ML IJ SOLN: 4 MG/2ML | INTRAMUSCULAR | Qty: 2

## 2019-07-16 MED FILL — MIDAZOLAM HCL 2 MG/2ML IJ SOLN: 2 mg/mL | INTRAMUSCULAR | Qty: 2

## 2019-07-16 MED FILL — PROPOFOL 200 MG/20ML IV EMUL: 200 MG/20ML | INTRAVENOUS | Qty: 20

## 2019-07-16 MED FILL — PROMETHAZINE HCL 12.5 MG PO TABS: 12.5 mg | ORAL | Qty: 1

## 2019-07-16 MED FILL — EPHEDRINE SULFATE (PRESSORS) 50 MG/ML IV SOLN: 50 mg/mL | INTRAVENOUS | Qty: 1

## 2019-07-16 MED FILL — DIPRIVAN 1000 MG/100ML IV EMUL: 1000 MG/100ML | INTRAVENOUS | Qty: 100

## 2019-07-16 MED FILL — CEFAZOLIN SODIUM-DEXTROSE 2-3 GM-%(50ML) IV SOLR: 2000 mg in Dextrose 3% | INTRAVENOUS | Qty: 50

## 2019-07-16 MED FILL — DEXAMETHASONE SODIUM PHOSPHATE 4 MG/ML IJ SOLN: 4 mg/mL | INTRAMUSCULAR | Qty: 2

## 2019-07-16 MED FILL — DOK PLUS 50-8.6 MG PO TABS: 50-8.6 mg | ORAL | Qty: 1

## 2019-07-16 MED FILL — BUPIVACAINE HCL (PF) 0.5 % IJ SOLN: 0.5 % | INTRAMUSCULAR | Qty: 10

## 2019-07-16 NOTE — Progress Notes (Signed)
Patient remains A/O. Continues to deny pain. Dressing to left knee remains D/I. Now has feeling in legs back to just above knee. Ice machine on left knee area. PACU care complete. Patient transported back to SDS via cart.

## 2019-07-16 NOTE — Discharge Instructions (Signed)
[facility] in [place]  [phone] (Same Day Surgery)    Physical Therapy appt with [facility] (with Dennie Bible)  [phone] register at 12:45 apt is at 1pm.   It is recommended that you use the iceman machine s frequently as possible for the first few days.  Once the dressing is removed.  Never place the iceman machine directly on your skin.  Place it on for 20 minutes for 3 to 4 times per day.    Do not drive, work around machines or use equipment.  Do not drink any alcoholic beverages.  Do not smoke while alone.  Avoid making important decisions.  Plan to spend a quiet, relaxed evening @ home.  Resume normal activities as you begin to feel better.  Eat lightly for your first meal, then gradually increase your diet to what is normal for you.  In case of nausea, avoid food and drink only clear liquids.  Resume food as nausea ceases.  Notify your surgeon if you experience fever, chills, large amount of bleeding, difficulty breathing, persistent nausea and vomiting or any other disturbing problem.  Call for a follow-up appointment with your surgeon. Advance Care Planning  People with COVID-19 may have no symptoms, mild symptoms, such as fever, cough, and shortness of breath or they may have more severe illness, developing severe and fatal pneumonia.  As a result, Advance Care Planning with attention to naming a health care decision maker (someone you trust to make healthcare decisions for you if you could not speak for yourself) and sharing other health care preferences is important BEFORE a possible health crisis. Please contact your Primary Care Provider to discuss Advance Care Planning.    Preventing the Spread of Coronavirus Disease 2019 in Homes and Residential Communities  For the most recent information go to [email or web address]    Prevention steps for People with confirmed or suspected COVID-19 (including persons  under investigation) who do not need to be hospitalized  and   People with confirmed COVID-19 who were hospitalized and determined to be medically stable to go home    Your healthcare provider and public health staff will evaluate whether you can be cared for at home. If it is determined that you do not need to be hospitalized and can be isolated at home, you will be monitored by staff from your local or state health department. You should follow the prevention steps below until a healthcare provider or local or state health department says you can return to your normal activities.  Stay home except to get medical care  People who are mildly ill with COVID-19 are able to isolate at home during their illness. You should restrict activities outside your home, except for getting medical care. Do not go to work, school, or public areas. Avoid using public transportation, ride-sharing, or taxis.  Separate yourself from other people and animals in your home  People: As much as possible, you should stay in a specific room and away from other people in your home. Also, you should use a separate bathroom, if available.  Animals: You should restrict contact with pets and other animals while you are sick with COVID-19, just like you would around other people. Although there have not been reports of pets or other animals becoming sick with COVID-19, it is still recommended that people sick with COVID-19 limit contact with animals until more information is known about the virus. When possible, have another member of your household care for  your animals while you are sick. If you are sick with COVID-19, avoid contact with your pet, including petting, snuggling, being kissed or licked, and sharing food. If you must care for your pet or be around animals while you are sick, wash your hands before and after you interact with pets and wear a facemask.  Call ahead before visiting your doctor  If you have a medical appointment, call  the healthcare provider and tell them that you have or may have COVID-19. This will help the healthcare provider's office take steps to keep other people from getting infected or exposed.  Wear a facemask  You should wear a facemask when you are around other people (e.g., sharing a room or vehicle) or pets and before you enter a healthcare provider's office. If you are not able to wear a facemask (for example, because it causes trouble breathing), then people who live with you should not stay in the same room with you, or they should wear a facemask if they enter your room.  Cover your coughs and sneezes  Cover your mouth and nose with a tissue when you cough or sneeze. Throw used tissues in a lined trash can. Immediately wash your hands with soap and water for at least 20 seconds or, if soap and water are not available, clean your hands with an alcohol-based hand sanitizer that contains at least 60% alcohol.  Clean your hands often  Wash your hands often with soap and water for at least 20 seconds, especially after blowing your nose, coughing, or sneezing; going to the bathroom; and before eating or preparing food. If soap and water are not readily available, use an alcohol-based hand sanitizer with at least 60% alcohol, covering all surfaces of your hands and rubbing them together until they feel dry.  Soap and water are the best option if hands are visibly dirty. Avoid touching your eyes, nose, and mouth with unwashed hands.  Avoid sharing personal household items  You should not share dishes, drinking glasses, cups, eating utensils, towels, or bedding with other people or pets in your home. After using these items, they should be washed thoroughly with soap and water.  Clean all "high-touch" surfaces everyday  High touch surfaces include counters, tabletops, doorknobs, bathroom fixtures, toilets, phones, keyboards, tablets, and bedside tables. Also, clean any surfaces that may have blood, stool, or body fluids  on them. Use a household cleaning spray or wipe, according to the label instructions. Labels contain instructions for safe and effective use of the cleaning product including precautions you should take when applying the product, such as wearing gloves and making sure you have good ventilation during use of the product.  Monitor your symptoms  Seek prompt medical attention if your illness is worsening (e.g., difficulty breathing). Before seeking care, call your healthcare provider and tell them that you have, or are being evaluated for, COVID-19. Put on a facemask before you enter the facility. These steps will help the healthcare provider's office to keep other people in the office or waiting room from getting infected or exposed. Ask your healthcare provider to call the local or state health department. Persons who are placed under active monitoring or facilitated self-monitoring should follow instructions provided by their local health department or occupational health professionals, as appropriate. When working with your local health department check their available hours.  If you have a medical emergency and need to call 911, notify the dispatch personnel that you have, or are being evaluated for  COVID-19. If possible, put on a facemask before emergency medical services arrive.  Discontinuing home isolation  Patients with confirmed COVID-19 should remain under home isolation precautions until the risk of secondary transmission to others is thought to be low. The decision to discontinue home isolation precautions should be made on a case-by-case basis, in consultation with healthcare providers and state and local health departments.         Learning About Total Knee Replacement Surgery  What is a total knee replacement?     A total knee replacement replaces the worn ends of the thighbone (femur) and the lower leg bone (tibia) where they meet at the knee. Sometimes the surface of the patella (kneecap) is replaced  too. You may want this surgery if you have knee pain, stiffness, swelling, or problems moving your knee that you cannot treat in other ways. For most people, these problems are caused by arthritis. They can also be caused by a knee injury.  If you need to have both knees replaced, you may have both surgeries at the same time. Or your doctor may recommend doing one knee at a time. Your doctor would replace the second knee after you recover from the first knee surgery. Recovery after a double knee replacement takes longer than after a single replacement.  How is a total knee replacement done?  Before surgery, you will get medicine to make you sleep or feel relaxed. If you will be awake during surgery, you will also get a shot of medicine into your spine to make your legs numb.  There are two types of replacement joints. They are:   Cemented joints. The cement acts as glue, attaching the new joint to the bone.   Uncemented joints. These have a metal coating with many small openings. Over time, new bone grows and fills up the openings. This new bone attaches the joint to the bone.  Your doctor may also use a combination of cemented and uncemented parts.  Your doctor makes a 4- to 10-inch cut, called an incision, on the front of your knee. Your doctor then:   Replaces the damaged part of your femur with a metal piece.   Replaces the damaged part of your tibia with a metal piece and plastic surface.   May replace part of your kneecap with plastic.  The doctor finishes the surgery by closing your incision with stitches, staples, skin glue, or tape strips.  What can you expect as you recover from total knee replacement surgery?  Your knee will be swollen and will hurt when you move it. You'll need to take pain medicine for a time after surgery. Most people will start to walk with a walker or crutches the day of surgery.  You'll start rehabilitation (rehab) before you leave the hospital. Rehab will help you improve  strength and movement in your knee.  You may need some extra support or help at home for the first few weeks.  After you recover, you should be able to do activities such as go for walks, dance, and ride a bike on flat ground. Talk to your doctor about whether you can do more strenuous activities.  Follow-up care is a key part of your treatment and safety. Be sure to make and go to all appointments, and call your doctor if you are having problems. It's also a good idea to know your test results and keep a list of the medicines you take.  Where can you learn more?  Go to [email or web address] and sign in to your MyChart account. Enter [phone] in the Search Health Information box to learn more about "Learning About Total Knee Replacement Surgery."     If you do not have an account, please click on the "Sign Up Now" link.  Current as of: November 16, 2020Content Version: 12.8   2006-2021 [employer].   Care instructions adapted under license by [facility]. If you have questions about a medical condition or this instruction, always ask your healthcare professional. [employer] disclaims any warranty or liability for your use of this information.

## 2019-07-16 NOTE — Op Note (Signed)
DATE OF PROCEDURE:  07/16/2019    PREOPERATIVE DIAGNOSIS:  Left knee DJD.    POSTOPERATIVE DIAGNOSIS:  Left knee DJD.    PROCEDURE:  Left total knee arthroplasty using [employer] Press-fit posterior stabilized knee with size 11 femoral component, size H tibial component, size 32 patellar component and size 11 tibial poly component.    SURGEON:  Phill Mutter, DO    FIRST ASSISTANT: Fuller Canada, PA    ANESTHESIA:  Spinal with regional block.    ESTIMATED BLOOD LOSS:  100 mL.    TOTAL TOURNIQUET TIME:  48 minutes.    FLUIDS:  2000 mL of crystalloids.    INDICATIONS FOR PROCEDURE:  The patient is a 44 year old male with  long-standing history of left knee pain.  For this, he underwent  conservative treatment as well as multiple prior left knee arthroscopies and ACL reconstructions with no relief of his symptoms.  X-rays revealed  severe arthritis in the left knee.  Given his persistent symptoms  despite conservative treatment and with his x-ray findings, I  recommended surgical treatment.  I explained the risks, benefits and  possible complications of the procedure to the patient and after  answering all of his questions, he consented to undergo the above  procedure.    REPORT OF PROCEDURE:  The patient was seen and evaluated in the  preoperative holding area where the left lower extremity was signed in  his presence.  At this point, care of the patient was turned over to  anesthesia team who performed a regional block to the left lower  extremity.  He was then transported back to the operative suite.  Spinal  anesthesia was performed and once adequate anesthesia was obtained, the  left lower extremity was prepped and draped in usual sterile fashion.   Preoperative antibiotics were administered, 1 gm of TXA was administered  IV.  At this point, a time-out was performed and all in attendance were  in agreement.    I exsanguinated the left lower extremity with the use of an Esmarch and  tourniquet was inflated  to 280 mmHg.  I then made a standard anterior  midline incision overlying the left knee and carried sharp dissection  down into the knee joint through a medial parapatellar incision.  I then  debrided a significant amount of intra-articular fat pad and  inflammatory synovium.  I then elevated the soft tissue off the proximal  and medial tibia with the use of Bovie.  I then everted the patella and  flexed the knee at 90 degrees.  I then debrided soft tissue around the  patella.  I then used a saw to perform a freehand cut of the patella and  the bone fragment was then elevated and discarded.  I then aligned a  sizing guide and found this to be a size 32 patella.  I then drilled the  three drill holes.  I then aligned a size 32 patella trial component,  which fit nicely on the patella.  Additional osteophytes were then  debrided around the patellar component.  The trial component was then  removed.    I then turned my attention for preparation of the distal femur.  I  placed retractors along the medial and lateral aspect of the distal  femur to protect the soft tissue.  I then inserted the intramedullary  drill into the femoral canal.  I then inserted the intramedullary distal  femoral cutting guide, which was malleted in place  and pinned in place.   I then used a saw to perform the distal femoral cut resecting 10 mm of  distal femoral bone.  The bone fragments were then discarded.  The  distal femoral cutting guide was then removed.  I then aligned the  sizing guide over the distal femur and found this to be a size 11 distal  femoral component.  I then drilled guide holes through the sizing guide,  which was then removed.  I then inserted the size 11 distal femoral  cutting block, which was malleted in place and pinned in place.  I then  used a saw to perform the anterior, posterior and chamfer cuts and the  bone fragments were then elevated and discarded.  The distal femoral  cutting guide was then removed.  I then  debrided the ACL and PCL out of  the femoral notch.    I then turned my attention for preparation of the proximal tibia.  I  placed a retractor along the posterior tibia, translating it anteriorly  as well as retractors along the medial and lateral aspect of the  proximal tibia to protect the soft tissue.  I then removed the remnant  of the medial and lateral meniscus ensuring to protect the popliteal  tendon and medial collateral ligament.  Once I had adequate exposure of  the proximal tibia, I aligned the proximal tibial cutting guide and once  the appropriate position was obtained, it was pinned in place.  I then  used a saw to perform the proximal tibial cut and the bone fragment was  then elevated and discarded.  I then placed a lamina spreader in the  lateral compartment of the knee and used a curved osteotome and curved  curette to remove very large osteophytes off the posterior aspect of  the medial femoral condyle.  I then placed a lamina spreader in the  medial compartment of the knee and used a curved osteotome and curved  curette to remove small osteophytes off the posterior aspect of the  lateral femoral condyle.  I then translated the proximal tibia  anteriorly again.  I then aligned a size H tibial base plate and once  the appropriate position was obtained, it was pinned in place.  I then  inserted a size 11 distal femoral trial component, which was malleted in  place until it was firmly seated.  With the trial in place, I then used  a saw to perform the box cut and the bone fragment was then elevated and  discarded.  I then inserted a trial size 11 tibial poly component and a  size 32 patellar component.  With all trial components in place, I ran  the knee through full range of motion and found there to be excellent  tracking of the patella with no laxity to varus or valgus stressing.   These were then selected for the final implant components.  Given the excellent bone quality and his age I decided  to proceed with press-fit components.    The trial components were then removed leaving the tibial base plate in  place.  I then drilled and punched the 2 pegs for the proximal tibia and  the tibial base plate was then removed.    The  bone ends were then irrigated with pulse lavage using sterile normal  saline with gentamicin, the bone ends were then dried. The final size H press-fit tibial component was then malleted in place until it was  firmly seated.   The final size 11 press-fit distal femoral component was then  malleted in place until it was firmly seated.  I then everted the patella,  which was irrigated with pulse lavage and then dried.  I them inserted the final size 32 patellar component which was then clamped in place until it was firmly seated.  The tourniquet was then deflated for a total of 48 minutes and adequate hemostasis was maintained.      The joint was then irrigated further with pulse lavage.  I  then inserted the final size 11 tibial poly component, which was locked  in place.  With all final components in place, I ran the knee through a  full range of motion and found there to be excellent tracking of the  patella with no laxity to varus or valgus stressing.    The joint was then irrigated further with pulse lavage.  One additional  gram of TXA was administered IV.  I then re-approximated the medial  parapatellar incision using #5 Ethibond suture and #1 Vicryl suture in  figure-of-eight fashion.  I then closed subcutaneous tissue using 2-0  Vicryl suture followed by skin closure with staples.  Adequate  hemostasis was maintained at all times.  I then applied a sterile soft  dressing to the left lower extremity.  The patient was then awakened  from anesthesia and transported to PACU in stable condition.  She  appeared to have tolerated the procedure well.      PROGNOSIS:  At this point, the patient will begin physical therapy in the same day surgery area.  Once the patient has passed with  physical therapy they will be discharged home.  They will continue in home physical therapy for gait training and can be  weightbearing as tolerated on the operative leg.  The patient will receive prescriptions for pain medication as well as antibiotic  prophylaxis and DVT prophylaxis upon their discharge. Following their discharge, I will see the patient back in the office in 3 weeks for staple removal and will continue to monitor their progress in the outpatient setting for resolution of their symptoms.        Rhaya Coale, DO

## 2019-07-16 NOTE — Progress Notes (Signed)
Patient admitted to PACU from OR. Patient awake, A/O. Denies pain. Dressing to lefft knee D/I. Patient now has feeling to left mid-thigh area. Will continue to monitor.

## 2019-07-16 NOTE — Progress Notes (Signed)
Patient back in SDS. Patient A/O, drinking fluids. Denies pain or other needs. Wife at bedside. Call light in reach.

## 2019-07-16 NOTE — Progress Notes (Signed)
The physical therapist states that the patient is ready to be discharged at this time.  Discharge instruction given to the patient and his wife.  They deny any further needs or questions at this time.

## 2019-07-16 NOTE — Brief Op Note (Signed)
Brief Postoperative Note      Patient: Scott Jacobs  Date of Birth: Apr 16, 1975  MRN: [id number]    Date of Procedure: 07/16/2019    Pre-Op Diagnosis: TRICOMPARTMENTAL OA OF LEFT KNEE    Post-Op Diagnosis: Same       Procedure(s):  LEFT KNEE TOTAL ARTHROPLASTY    Surgeon(s):  Candelaria Celeste, DO    Assistant:  * No surgical staff found *    Anesthesia: Spinal    Estimated Blood Loss (mL): 150    Complications: None    Specimens:   * No specimens in log *    Implants:  Implant Name Type Inv. Item Serial No. Manufacturer Lot No. LRB No. Used Action   PSN FEM PS POR CCR STD SZ11 L  PSN FEM PS POR CCR STD SZ11 L  [employer] [id number] Left 1 Implanted   PSN TIB POR 2 PEG SZ H L  PSN TIB POR 2 PEG SZ H L  [employer] [id number] Left 1 Implanted   IMPL KNEE ARTIC LT Knee IMPL KNEE ARTIC LT  [employer] [id number] Left 1 Implanted   COMPONENT PAT DIA32MM THK10MM STD TI POLY TRABECULAR MTL  COMPONENT PAT DIA32MM THK10MM STD TI POLY TRABECULAR MTL  [employer] [id number] Left 1 Implanted         Drains: * No LDAs found *    Findings: L knee OA    Electronically signed by Candelaria Celeste, DO on 07/16/2019 at 2:03 PM

## 2019-07-16 NOTE — Progress Notes (Signed)
Patient with physical therapist.  States his right leg still feels "wobbly" and that he is getting more feeling in left leg but they still feel weak.  He is not able to take any steps at this time.  Will continue to monitor him closely.

## 2019-07-16 NOTE — H&P (Signed)
Subjective:      Patient ID: Scott Jacobs is a 44 y.o. male.  ??  Patient returns to the office to discuss possible replacement of the left knee. Patient had steroid injection last administered on 03/25/2019 which provided no relief. Since last office visit, patient has had worsening left knee which is now affecting his job and ADLs with the knee giving out and increased instabilty. Pain is rated at a 6/10 which remains a sharp, stabbing sensation along the medial aspect of the knee. He continues to rest, elevate, and take Ibuprofen with no relief achieved. Hx of two prior knee arthroscopy with ACL reconstruction. Patient is ready to discuss knee replacement.  ??  ??  He comes in today for a recheck of his left knee pain.  He states that since I saw him last he has continued dealing with a grinding, deep and throbbing pain globally in his left knee.  He also continues to have intermittent instability and intermittent sharp and stabbing pain in the left knee.  He states that now the pain is limiting his ability to work and perform his daily activities and the pain is becoming intolerable.  Patient denies any new injury to the involved extremity/ joint, denies numbness or tingling in the involved extremity and denies fever or chills.  ??  He would like to proceed with surgical treatment at this point.  ??  ??  ??  ??  ??  Knee Pain   Pertinent negatives include no numbness.   ??  ??  Review of Systems   Constitutional: Negative for activity change, chills and fever.   Respiratory: Negative for chest tightness.    Cardiovascular: Negative for chest pain.   Musculoskeletal: Positive for arthralgias, gait problem, joint swelling and myalgias. Negative for back pain.   Skin: Negative for color change, pallor, rash and wound.   Neurological: Negative for weakness and numbness.   ??  ??  Past Medical History   Past Medical History:   Diagnosis Date   ??? Hypothyroidism ??   ??? Knee pain ??      ??  ??  No current facility-administered  medications on file prior to encounter.      Current Outpatient Medications on File Prior to Encounter   Medication Sig Dispense Refill   ??? ibuprofen (ADVIL;MOTRIN) 800 MG tablet TAKE 1 TABLET BY MOUTH 3 TIMES DAILY WITH MEALS 270 tablet 3   ??? levothyroxine (SYNTHROID) 112 MCG tablet TAKE 2 TABLETS BY MOUTH EVERY DAY 180 tablet 3     Allergies   Allergen Reactions   ??? Azithromycin Hives and Itching   ??? Clindamycin Hives and Itching     Past Surgical History:   Procedure Laterality Date   ??? ANTERIOR CRUCIATE LIGAMENT REPAIR Left 1995, 2007    X2   ??? KNEE ARTHROSCOPY Left 2019   ??? NASAL SEPTUM SURGERY  2017   ??? TONSILLECTOMY  2017     Social History     Tobacco Use   ??? Smoking status: Former Smoker     Packs/day: 0.50     Years: 8.00     Pack years: 4.00     Types: Cigarettes     Quit date: 2007     Years since quitting: 14.2   ??? Smokeless tobacco: Former Neurosurgeon     Types: Snuff     Quit date: 1997   Substance Use Topics   ??? Alcohol use: Yes     Comment: RARELY   ???  Drug use: No     Family History   Problem Relation Age of Onset   ??? Cancer Mother    ??? Cancer Father    ??? Cancer Maternal Grandmother    ??? Cancer Maternal Grandfather    ??? Alzheimer's Disease Paternal Grandmother    ??? Cancer Paternal Grandfather        Objective:   Physical Exam  Constitutional:       Appearance: He is well-developed.   HENT:      Head: Normocephalic.   Eyes:      Pupils: Pupils are equal, round, and reactive to light.   Neck:      Musculoskeletal: Normal range of motion.   Pulmonary:      Effort: Pulmonary effort is normal.   Musculoskeletal: Normal range of motion.         General: Swelling and tenderness present. No deformity.      Right hip: Normal.      Left hip: Normal.      Right knee: He exhibits normal range of motion, no swelling, no effusion, no ecchymosis, no deformity, no laceration, no erythema, normal alignment, no LCL laxity, normal patellar mobility, no bony tenderness and no MCL laxity. No tenderness found. No medial joint  line, no lateral joint line, no MCL, no LCL and no patellar tendon tenderness noted.      Left knee: He exhibits swelling and bony tenderness. He exhibits normal range of motion, no effusion, no ecchymosis, no deformity, no laceration, no erythema, normal alignment, no LCL laxity, normal patellar mobility and no MCL laxity. Tenderness found. Medial joint line, lateral joint line and patellar tendon tenderness noted. No MCL and no LCL tenderness noted.   Skin:     General: Skin is warm and dry.      Capillary Refill: Capillary refill takes less than 2 seconds.      Coloration: Skin is not pale.      Findings: No erythema or rash.   Neurological:      Mental Status: He is alert and oriented to person, place, and time.    Left knee-Skin intact with no erythema, ecchymosis or lacerations present.  Multiple well-healed surgical scars, no redness, no signs of infection.  0-140  Moderate laxity with anterior Lachman's test  ??  Xr Knee Left (3 Views)  ??  Result Date: 01/21/2019  XRAY X-ray 3 views of the left knee reviewed by me today in the office demonstrates age appropriate bone density throughout with severe degenerative changes with medial joint space collapse, moderate lateral and patellofemoral joint space narrowing, moderate osteophyte formation in all 3 compartments as well as posterior, there is a radiopaque interference screw in the distal femur from prior ACL reconstruction, normal tracking of the patella, no acute osseous abnormalities. Impression: Severe degenerative changes of the left knee as above with no acute process.   ??  ??BP (!) 158/104    Pulse 69    Temp 98.2 ??F (36.8 ??C) (Temporal)    Resp 16    SpO2 95%     Assessment:   Left knee DJD, severe  Left knee multiple ACL reconstructive surgeries.                Plan:       I discussed with him again today his x-ray findings.  At this point given the severity of his symptoms and with his worsening symptoms and x-ray findings I recommend surgical  treatment.  I had  a lengthy discussion with him today in regards to left total knee replacement surgery.  I explained risks, benefits, possible complications of the procedure and answered all questions for the patient.    I explained postoperative rehabilitation protocol and expectations with the patient today.  The patient understands and consents to the procedure.    Patient will follow up with their primary care physician prior to surgical treatment for preoperative clearance.  We will schedule surgery at soonest convenience.  Continue weight-bearing as tolerated.  Continue range of motion exercises as instructed.  Ice and elevate as needed.  Tylenol or Motrin for pain.  Follow up in 1 week postop.  He would like to have the surgery at [facility], would like to do this as an outpatient and will likely be a press-fit candidate.  ??  ??       The patient was counseled at length about the risks of contracting Covid-19 during their perioperative period and any recovery window from their procedure.  The patient was made aware that contracting Covid-19  may worsen their prognosis for recovering from their procedure  and lend to a higher morbidity and/or mortality risk.  All material risks, benefits, and reasonable alternatives including postponing the procedure were discussed. The patient does wish to proceed with the procedure at this time.      Pt seen and examined, No change in H+P.           ??  Tatanisha Cuthbert, DO

## 2019-07-16 NOTE — Progress Notes (Addendum)
Physical Therapy    Facility/Department: [facility] OR  Initial Assessment    NAME: Scott Jacobs  DOB: [birth year]  MRN: [id number]    Date of Service: 07/16/2019    Discharge Recommendations:  Outpatient PT, Home with assist PRN        Assessment   Body structures, Functions, Activity limitations: Decreased functional mobility   Treatment Diagnosis: impaired mobility  Prognosis: Good  Decision Making: Low Complexity  History: no PF  Exam: ROM/ strength  Clinical Presentation: changing characteristics of funciton due to post op recovery  Barriers to Learning: none  REQUIRES PT FOLLOW UP: No     Patient apppropriate for discharge to home- meeting PT goals- has HEP  Patient Diagnosis(es): There were no encounter diagnoses.     has a past medical history of Arthritis, Hypothyroidism, Knee pain, and Obesity.   has a past surgical history that includes Anterior cruciate ligament repair (Left, 1995, 2007); Nasal septum surgery (2017); Tonsillectomy (2017); Knee arthroscopy (Left, 2019); and joint replacement (Left, 07/16/2019).    Restrictions  Restrictions/Precautions  Restrictions/Precautions: Weight Bearing  Lower Extremity Weight Bearing Restrictions  Left Lower Extremity Weight Bearing: Weight Bearing As Tolerated  Vision/Hearing  Vision: Within Functional Limits  Hearing: Within functional limits     Subjective  General  Chart Reviewed: Yes  Patient assessed for rehabilitation services?: Yes  Follows Commands: Within Functional Limits     Pre Treatment Pain Screening  Pain at present: 0    Orientation  Orientation  Overall Orientation Status: Within Normal Limits  Social/Functional History  Social/Functional History  Lives With: Spouse  Type of Home: House  Home Layout: One level  Home Access: Stairs to enter without rails  Entrance Stairs - Number of Steps: 2  Bathroom Shower/Tub: Administrator, Civil Service: Standard  Home Equipment: Conservation officer, nature, Crutches  Receives Help From: Family  ADL Assistance:  Independent  Homemaking Assistance: Independent  Homemaking Responsibilities: Yes  Ambulation Assistance: Independent  Transfer Assistance: Teacher, English as a foreign language: Yes  Mode of Transportation: Musician  Occupation: Full time employment  Type of occupation: [employer] at [place]: "not a lot"  Cognition        Objective     Observation/Palpation  Observation: lying in bed HOB elevated; L knee wrapped  Sensation -impaired to light touch L leg  PROM LLE (degrees)  LLE General PROM: seated over bed FLEX 75*- lying in bed EXT full (knee wrapped)  Strength LLE  Comment: I SLR        Bed mobility  Rolling to Left: Modified independent  Rolling to Right: Modified independent  Supine to Sit: Independent  Sit to Supine: Minimal assistance(for leg control)  Transfers  Sit to Stand: Modified independent  Stand to sit: Modified independent  Ambulation  Ambulation?: Yes  Ambulation 1  Device: Rolling Walker  Assistance: Contact guard assistance  Gait Deviations: None  Distance: 25 ft              Plan   Safety Devices  Type of devices: Gait belt, Nurse notified, Left in bed    G-Code       OutComes Score                                                  AM-PAC Score  Goals  Short term goals  Time Frame for Short term goals: 1 day  Short term goal 1: patient will be mod I with all bed mobnility and transfers  Short term goal 2: patient will ambualte with walker and cga       Therapy Time   Individual Concurrent Group Co-treatment   Time In 1800         Time Out 1845         Minutes 45         Timed Code Treatment Minutes: 30 Minutes       Jaycee Mckellips Delphina Cahill, PT

## 2019-07-17 MED FILL — CEFAZOLIN SODIUM-DEXTROSE 2-3 GM-%(50ML) IV SOLR: 2000 mg in Dextrose 3% | INTRAVENOUS | Qty: 50

## 2019-07-20 ENCOUNTER — Inpatient Hospital Stay: Admit: 2019-07-20 | Payer: PRIVATE HEALTH INSURANCE | Primary: Family Medicine

## 2019-07-20 DIAGNOSIS — M1712 Unilateral primary osteoarthritis, left knee: Secondary | ICD-10-CM

## 2019-07-20 NOTE — Progress Notes (Signed)
Physical Therapy  Re Assessment  Date: 07/20/2019  Patient Name: Scott Jacobs  MRN: [id number]  DOB: 12/10/1975     Treatment Diagnosis: impaired mobility/ L knee stifness    Restrictions  Restrictions/Precautions  Restrictions/Precautions: Weight Bearing  Lower Extremity Weight Bearing Restrictions  Left Lower Extremity Weight Bearing: Weight Bearing As Tolerated    Subjective   General  Chart Reviewed: Yes  Patient assessed for rehabilitation services?: Yes  Additional Pertinent Hx: L knee: 2 ACL surgeries, 1 follow up arhroscopy 2019  Family / Caregiver Present: No  Referring Practitioner: Adline Potter  Diagnosis: L TKA 4//8/21  Follows Commands: Within Functional Limits  PT Visit Information  PT Insurance Information: UMR  Pain Assessment  Pain Level: 7  Patient's Stated Pain Goal: No pain  Pain Location: Knee  Pain Orientation: Left  Pain Frequency: Continuous  Pain Onset: On-going  Clinical Progression: Gradually improving  Functional Pain Assessment: Prevents or interferes with all active and some passive activities    Vision/Hearing  Vision  Vision: Within Functional Limits  Hearing  Hearing: Within functional limits    Orientation  Orientation  Overall Orientation Status: Within Normal Limits    Social/Functional History  Social/Functional History  Lives With: Family  Home Layout: Performs ADL's on one level  Home Access: Stairs to enter without rails  Entrance Stairs - Number of Steps: 2  Bathroom Shower/Tub: Tub/Shower unit  Home Equipment: Crutches;Rolling walker  ADL Assistance: Independent  Homemaking Assistance: Independent  Ambulation Assistance: Teacher, English as a foreign language: (not currently)  Occupation: Full time employment  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  Additional Comments: ellipitical/ universal gym @ home    Objective     Observation/Palpation  Posture: Good  Scar: incision stapled- appears to be healing- slight amount of  bleeding present    AROM LLE (degrees)  LLE General  AROM: L knee AROM 65* exercise- knee EXT ROM -10* in supine    Strength LLE  L Knee Extension: At least;3/5;3+/5                      Ambulation  Ambulation?: Yes  Ambulation 1  Surface: carpet  Device: Rolling Walker  Assistance: Modified Independent  Gait Deviations: Slow Cadence                            Assessment   Conditions Requiring Skilled Therapeutic Intervention  Assessment: LEFS 11/80  Treatment Diagnosis: impaired mobility/ L knee stifness  Decision Making: Medium Complexity  History: PF- Hx of multiple L knee surgeries  Exam: L knee ROM./LEFS  Clinical Presentation: changing characteriistics of function due to pain  Barriers to Learning: none  REQUIRES PT FOLLOW UP: Yes         Plan        G-Code       OutComes Score                                                  AM-PAC Score             Goals  Long term goals  Time Frame for Long term goals : 6 weeks 09/06/19  Long term goal 1: patient's goal increase L knee ROM and walk without assistive device  Long term goal 2: patient  will score 40/80 indicating improved LE function  Long term goal 3: patient will have 115* L knee FLEX AROM       Therapy Time   Individual Concurrent Group Co-treatment   Time In 1248         Time Out 1345         Minutes 57         Timed Code Treatment Minutes: 25 Minutes       Lionel Woodberry Delphina Cahill, PT

## 2019-07-20 NOTE — Other (Addendum)
Outpatient Physical Therapy  [place]           []  Phone: [phone]   Fax: [phone]  [facility]           []  Phone: [phone]   Fax: [phone]        Physical Therapy Daily Treatment Note  Date:  07/21/2019    Patient Name:  Scott Jacobs    DOB:  09/04/1975  MRN: [id number]  Restrictions/Precautions:  WBAT L  Diagnosis:   Diagnosis: L TKA 4//8/21  Date of Injury/Surgery:   Treatment Diagnosis: Treatment Diagnosis: impaired mobility/ L knee stifness    Insurance/Certification information: PT Insurance Information: UMR   Referring Physician:  Referring Practitioner: Adline Potter  Next Doctor Visit:    Plan of care signed (Y/N):    Outcome Measure: LEFS 11/80  Visit# / total visits:   1/10 then PN  Pain level: 7/10 L knee  Goals:          Long term goals  Time Frame for Long term goals : 6 weeks 09/06/19  Long term goal 1: patient's goal increase L knee ROM and walk without assistive device  Long term goal 2: patient will score 40/80 indicating improved LE function  Long term goal 3: patient will have 115* L knee FLEX AROM    Summary of Re- Evaluation:  Patient primary complaints:  impaired mobility/ L knee stifness   History of condition:L TKA 4//8/21  Current functional limitations: ambulating with walker, needs assist with putting L shoe/ sock on; pain/ stiffness interferes with all ADL  Clinical findings: LEFS 11/80; : L knee AROM 65* exercise- knee EXT ROM -10* in supine; I SLR with lag  PLOF:employed @ [employer]- ADL/IADL function only limited by l knee pain  Skilled PT interventions are intended [id number] L knee ROM/ L leg strength which will enable patient  to return/ better manage chronic /improve quality of life  Patient agrees with established plan of care and assisted in the development of their  goals  Barriers to learning:none- no mental/cognitive barriers observed  Preferred learning style(s):   written- demonstration -practice  Preferred Language: English  Potential barriers to  progress:none  The patient appears motivated to participate in PT and regain PLOF: yes    Subjective:  See eval         Any changes in Ambulatory Summary Sheet?  None        Objective:  See eval   Prior to today's treatment session, patient was screened for signs and symptoms related to COVID-19 including but not limited to verbally answering questions related to feeling ill, cough, or SOB, along with taking temperature via forehead thermometer. Patient presented with all negative signs and symptoms and had no fever >100 degrees Fahrenheit this date.         Exercises: (No more than 4 columns)   Exercise/Equipment Date  07/20/19 Date Date           WARM UP         nustep Seat 15 arms 14 load 2 x 8'           TABLE      SAQ Med/Small  roll 5 ct x15     SLR 1 x10      Heel prop Small   X 3'     AAROM FLEX With sheet x10 reps              STANDING  PROPRIOCEPTION                                    MODALITIES See below                     Other Therapeutic Activities/Education:        Home Exercise Program: continue HEP  Per pre op instruction - patient encouraged to perform 50 SLR per day      Manual Treatments:  Passive heel cord stretch      Modalities: Patient received vasocompression on their L knee  for pain and inflammation for 15 min on low pressure. Patient had negative skin reaction afterwards.        Communication with other providers:        Assessment:  (Response towards treatment session) (Pain Rating)    Pt tolerated  treatment without any adverse reactions or complications this date. . Pt would continue to benefit from skilled therapy interventions to address remaining impairments, improve mobility and strength,  and progress toward goal completion and prepare for d/c including finalizing HEP ; ..  Pain complaints after session 5/10 L knee      Plan for Next Session:  start high marches      Time In / Time Out:    See re eval           Timed Code/Total  Treatment Minutes:  25' TE x2/57' includes re- eval and vaso      Next Progress Note due:        Plan of Care Interventions:  [x]  Therapeutic Exercise  []  Modalities:  [x]  Therapeutic Activity     []  Ultrasound  [x]  Estim  []  Gait Training      []  Cervical Traction []  Lumbar Traction  [x]  Neuromuscular Re-education    [x]  Cold/hotpack []  Iontophoresis   [x]  Instruction in HEP      [x]  Vasopneumatic   []  Dry Needling    [x]  Manual Therapy               []  Aquatic Therapy              Electronically signed by:  Naeemah Jasmer,PT 07/21/2019, 8:42 AM

## 2019-07-20 NOTE — Plan of Care (Signed)
Outpatient Physical Therapy           [place]           []  Phone: [phone]   Fax: [phone]  [facility]           [x]  Phone: [phone]   Fax: [phone]     To: Referring Practitioner: Adline Potter    From: Susa Simmonds, PT     Patient: Scott Jacobs       DOB: Dec 05, 1975  Diagnosis: Diagnosis: L TKA 4//8/21   Treatment Diagnosis: Treatment Diagnosis: impaired mobility/ L knee stifness   Date: 07/21/2019    Physical Therapy Certification/Re-Certification Form    The following patient has been evaluated for physical therapy services and for therapy to continue, insurance requires physician review of the treatment plan initially and every 90 days. Please review the attached evaluation and/or summary of the patient's plan of care, and verify that you agree therapy should continue by signing the attached document and sending it back to our office.      ASSESSMENT  Patient primary complaints:  impaired mobility/ L knee stifness   History of condition:L TKA 4//8/21  Current functional limitations: ambulating with walker, needs assist with putting L shoe/ sock on; pain/ stiffness interferes with all ADL  Clinical findings: LEFS 11/80; : L knee AROM 65* exercise- knee EXT ROM -10* in supine; I SLR with lag  PLOF:employed @ [employer]- ADL/IADL function only limited by l knee pain  Skilled PT interventions are intended [id number] L knee ROM/ L leg strength which will enable patient  to return/ better manage chronic /improve quality of life  Patient agrees with established plan of care and assisted in the development of their  goals  Barriers to learning:none- no mental/cognitive barriers observed  Preferred learning style(s):   written- demonstration -practice  Preferred Language: English  Potential barriers to progress:none  The patient appears motivated to participate in PT and regain PLOF: yes  Plan of Care/Treatment to date:  [x]  Therapeutic Exercise  []  Modalities:  [x]  Therapeutic Activity     []   Ultrasound  [x]  Electrical Stimulation  [x]  Gait Training      []  Cervical Traction []  Lumbar Traction  [x]  Neuromuscular Re-education    []  Cold/hotpack []  Iontophoresis   [x]  Instruction in HEP      [x]  Vasopneumatic    []  Dry Needling  [x]  Manual Therapy               []  Aquatic Therapy       Other:          Frequency/Duration:  # Days per week: []  1 day # Weeks: []  1 week []  5 weeks     [x]  2 days   []  2 weeks [x]  6 weeks     [x]  3 days   []  3 weeks []  7 weeks     []  4 days   []  4 weeks []  8 weeks         []  9 weeks []  10 weeks         []  11 weeks []  12 weeks    Rehab Potential/Progress: []  Excellent [x]  Good []  Fair  []  Poor     Goals:       Long term goals  Time Frame for Long term goals : 6 weeks 09/06/19  Long term goal 1: patient's goal increase L knee ROM and walk without assistive device  Long term goal 2: patient will score 40/80 indicating improved  LE function  Long term goal 3: patient will have 115* L knee FLEX AROM  Electronically signed by:  Iven Finn, PT, 07/21/2019, 8:38 AM      If you have any questions or concerns, please don't hesitate to call.  Thank you for your referral.      Physician Signature:________________________________Date:_________ TIME: _____  By signing above, therapist???s plan is approved by physician

## 2019-07-22 ENCOUNTER — Inpatient Hospital Stay: Admit: 2019-07-22 | Payer: PRIVATE HEALTH INSURANCE | Primary: Family Medicine

## 2019-07-22 ENCOUNTER — Ambulatory Visit
Admit: 2019-07-22 | Discharge: 2019-07-22 | Payer: PRIVATE HEALTH INSURANCE | Attending: Orthopaedic Surgery | Primary: Family Medicine

## 2019-07-22 DIAGNOSIS — Z09 Encounter for follow-up examination after completed treatment for conditions other than malignant neoplasm: Secondary | ICD-10-CM

## 2019-07-22 MED ORDER — OXYCODONE-ACETAMINOPHEN 5-325 MG PO TABS
5-325 MG | ORAL_TABLET | Freq: Four times a day (QID) | ORAL | 0 refills | Status: DC | PRN
Start: 2019-07-22 — End: 2019-07-28

## 2019-07-22 NOTE — Other (Signed)
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  Date of Injury/Surgery:   Treatment Diagnosis: Treatment Diagnosis: impaired mobility/ L knee stifness    Insurance/Certification information: PT Insurance Information: UMR   Referring Physician:  Referring Practitioner: [id number]  Next Doctor Visit:    Plan of care signed (Y/N):    Outcome Measure: LEFS 11/80  Visit# / total visits:  2/10 then PN  Pain level: 5/10 L knee  Goals:          Long term goals  Time Frame for Long term goals : 6 weeks 09/06/19  Long term goal 1: patient's goal increase L knee ROM and walk without assistive device  Long term goal 2: patient will score 40/80 indicating improved LE function  Long term goal 3: patient will have 115* L knee FLEX AROM    Summary of Re- Evaluation:  Patient primary complaints:  impaired mobility/ L knee stifness   History of condition:L TKA 4//8/21  Current functional limitations: ambulating with walker, needs assist with putting L shoe/ sock on; pain/ stiffness interferes with all ADL  Clinical findings: LEFS 11/80; : L knee AROM 65* exercise- knee EXT ROM -10* in supine; I SLR with lag  PLOF:employed @ 02-25-1974- ADL/IADL function only limited by l knee pain  Skilled PT interventions are intended 12/80 L knee ROM/ L leg strength which will enable patient  to return/ better manage chronic /improve quality of life  Patient agrees with established plan of care and assisted in the development of their  goals  Barriers to learning:none- no mental/cognitive barriers observed  Preferred learning style(s):   written- demonstration -practice  Preferred Language: English  Potential barriers to  progress:none  The patient appears motivated to participate in PT and regain PLOF: yes    Subjective:   Patient reports of 5/10 pain upon arrival and appears amb with a RW, and voices no new c/o.        Any changes in Ambulatory Summary Sheet?  None        Objective:   AAROM Flex 100*  Ext -20* after prop  Prior to today's treatment session, patient was screened for signs and symptoms related to COVID-19 including but not limited to verbally answering questions related to feeling ill, cough, or SOB, along with taking temperature via forehead thermometer. Patient presented with all negative signs and symptoms and had no fever >100 degrees Fahrenheit this date.         Exercises: (No more than 4 columns)   Exercise/Equipment Date  07/20/19 Date 07/22/2019 Date           WARM UP         nustep Seat 15 arms 14 load 2 x 8' S14-12/A14 Lv3 10'          TABLE      SAQ Med/Small  roll 5 ct x15 Med/Small  roll 5 ct x15    SLR 1 x10  Initiate w/QS 10x    Heel prop Small   X 3' Small roll  4'    AAROM FLEX With sheet x10 reps With sheet 10x10"             [place] See below See below                    Other Therapeutic Activities/Education:        Home Exercise Program: continue HEP  Per pre op instruction - patient encouraged to perform 50 SLR per day      Manual Treatments:  Passive heel cord stretch      Modalities: Patient received vasocompression on their L knee  for pain and inflammation for 15 min on low pressure. Patient had negative skin reaction afterwards.        Communication with other providers:        Assessment:  (Response towards treatment session) (Pain Rating)    Pt tolerated  treatment without any adverse reactions or complications this date. . Pt would continue to benefit from skilled therapy interventions to address remaining impairments, improve mobility and strength,  and progress toward goal  completion and prepare for d/c including finalizing HEP ; ..  Pain complaints after session 6/10 L knee      Plan for Next Session:  start high marches      Time In / Time Out:    1617/1707           Timed Code/Total Treatment Minutes:  50  15vaso 35te      Next Progress Note due:        Plan of Care Interventions:  [x]  Therapeutic Exercise  []  Modalities:  [x]  Therapeutic Activity     []  Ultrasound  [x]  Estim  []  Gait Training      []  Cervical Traction []  Lumbar Traction  [x]  Neuromuscular Re-education    [x]  Cold/hotpack []  Iontophoresis   [x]  Instruction in HEP      [x]  Vasopneumatic   []  Dry Needling    [x]  Manual Therapy               []  Aquatic Therapy              Electronically signed by:  Georgette Shell 07/22/2019, 4:17 PM

## 2019-07-24 ENCOUNTER — Inpatient Hospital Stay: Admit: 2019-07-24 | Payer: PRIVATE HEALTH INSURANCE | Primary: Family Medicine

## 2019-07-24 NOTE — Other (Signed)
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  Date of Injury/Surgery:   Treatment Diagnosis: Treatment Diagnosis: impaired mobility/ L knee stifness    Insurance/Certification information: PT Insurance Information: UMR   Referring Physician:  Referring Practitioner: [id number]  Next Doctor Visit:    Plan of care signed (Y/N):    Outcome Measure: LEFS 11/80  Visit# / total visits:  3/10 then PN  Pain level: 7/10 L knee  Goals:          Long term goals  Time Frame for Long term goals : 6 weeks 09/06/19  Long term goal 1: patient's goal increase L knee ROM and walk without assistive device  Long term goal 2: patient will score 40/80 indicating improved LE function  Long term goal 3: patient will have 115* L knee FLEX AROM    Summary of Re- Evaluation:  Patient primary complaints:  impaired mobility/ L knee stifness   History of condition:L TKA 4//8/21  Current functional limitations: ambulating with walker, needs assist with putting L shoe/ sock on; pain/ stiffness interferes with all ADL  Clinical findings: LEFS 11/80; : L knee AROM 65* exercise- knee EXT ROM -10* in supine; I SLR with lag  PLOF:employed @ 02-25-1974- ADL/IADL function only limited by l knee pain  Skilled PT interventions are intended 12/80 L knee ROM/ L leg strength which will enable patient  to return/ better manage chronic /improve quality of life  Patient agrees with established plan of care and assisted in the development of their  goals  Barriers to learning:none- no mental/cognitive barriers observed  Preferred learning style(s):   written- demonstration -practice  Preferred Language: English  Potential barriers to  progress:none  The patient appears motivated to participate in PT and regain PLOF: yes    Subjective:   Patient reports of 7/10 pain upon arrival and appears amb with a RW, and reports a slight increase in pain after last treatment.        Any changes in Ambulatory Summary Sheet?  None        Objective:   AAROM Flex 110*  Ext -20* after prop   COVID screening questions were asked and patient attested that there had been no contact or symptoms        Exercises: (No more than 4 columns)   Exercise/Equipment Date  07/20/19 Date 07/22/2019 Date 07/24/2019           WARM UP         nustep Seat 15 arms 14 load 2 x 8' S14-12/A14 Lv3 10' S11  Lv3 10'         TABLE      SAQ Med/Small  roll 5 ct x15 Med/Small  roll 5 ct x15 Med/Small  roll 5 ct x15 ea   SLR 1 x10  Initiate w/QS 10x Initiate w/QS 15x   Heel prop Small   X 3' Small roll 4' Small roll 5'   AAROM FLEX With sheet x10 reps With sheet 10x10" With strap 10x10"  STANDING                                                     PROPRIOCEPTION                                    MODALITIES See below See below See below                   Other Therapeutic Activities/Education:        Home Exercise Program: continue HEP  Per pre op instruction - patient encouraged to perform 50 SLR per day      Manual Treatments:  Passive heel cord stretch      Modalities: Patient received vasocompression on their L knee  for pain and inflammation for 15 min on low pressure. Patient had negative skin reaction afterwards.        Communication with other providers:        Assessment:  (Response towards treatment session) (Pain Rating)    Pt tolerated  treatment without any adverse reactions or complications this date. . Pt would continue to benefit from skilled therapy interventions to address remaining impairments, improve mobility and strength,  and progress toward goal completion and prepare for d/c including finalizing HEP ; ..  Pain complaints after session 7/10 L knee      Plan for  Next Session:  start high marches      Time In / Time Out:    1347/1432           Timed Code/Total Treatment Minutes:  45  15vaso 30te       Next Progress Note due:        Plan of Care Interventions:  [x]  Therapeutic Exercise  []  Modalities:  [x]  Therapeutic Activity     []  Ultrasound  [x]  Estim  []  Gait Training      []  Cervical Traction []  Lumbar Traction  [x]  Neuromuscular Re-education    [x]  Cold/hotpack []  Iontophoresis   [x]  Instruction in HEP      [x]  Vasopneumatic   []  Dry Needling    [x]  Manual Therapy               []  Aquatic Therapy              Electronically signed by:  Georgette Shell 07/24/2019, 1:47 PM

## 2019-07-27 ENCOUNTER — Encounter: Primary: Family Medicine

## 2019-07-28 ENCOUNTER — Encounter

## 2019-07-29 ENCOUNTER — Inpatient Hospital Stay: Admit: 2019-07-29 | Payer: PRIVATE HEALTH INSURANCE | Primary: Family Medicine

## 2019-07-29 MED ORDER — OXYCODONE-ACETAMINOPHEN 5-325 MG PO TABS
5-325 MG | ORAL_TABLET | Freq: Four times a day (QID) | ORAL | 0 refills | Status: DC | PRN
Start: 2019-07-29 — End: 2019-08-04

## 2019-07-29 NOTE — Other (Signed)
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  Date of Injury/Surgery:   Treatment Diagnosis: Treatment Diagnosis: impaired mobility/ L knee stifness    Insurance/Certification information: PT Insurance Information: UMR   Referring Physician:  Referring Practitioner: Adline Potter  Next Doctor Visit:    Plan of care signed (Y/N):    Outcome Measure: LEFS 11/80  Visit# / total visits:  4/10 then PN  Pain level: 3-4/10 L knee  Goals:          Long term goals  Time Frame for Long term goals : 6 weeks 09/06/19  Long term goal 1: patient's goal increase L knee ROM and walk without assistive device  Long term goal 2: patient will score 40/80 indicating improved LE function  Long term goal 3: patient will have 115* L knee FLEX AROM    Summary of Re- Evaluation:  Patient primary complaints:  impaired mobility/ L knee stifness   History of condition:L TKA 4//8/21  Current functional limitations: ambulating with walker, needs assist with putting L shoe/ sock on; pain/ stiffness interferes with all ADL  Clinical findings: LEFS 11/80; : L knee AROM 65* exercise- knee EXT ROM -10* in supine; I SLR with lag  PLOF:employed @ [employer]- ADL/IADL function only limited by l knee pain  Skilled PT interventions are intended IN:OMVEHMCN L knee ROM/ L leg strength which will enable patient  to return/ better manage chronic /improve quality of life  Patient agrees with established plan of care and assisted in the development of their  goals  Barriers to learning:none- no mental/cognitive barriers observed  Preferred learning style(s):   written- demonstration -practice  Preferred Language: English  Potential barriers to  progress:none  The patient appears motivated to participate in PT and regain PLOF: yes    Subjective:   Patient reports of 3-4/10 pain upon arrival and appears amb Indep., and feels he doesn't need it stating: "I'm feeling pretty stable without it."        Any changes in Ambulatory Summary Sheet?  None        Objective:   AAROM Flex 110*  Ext -20* after prop   COVID screening questions were asked and patient attested that there had been no contact or symptoms        Exercises: (No more than 4 columns)   Exercise/Equipment Date  07/20/19 Date 07/22/2019 Date 07/24/2019 07/29/2019            WARM UP          nustep Seat 15 arms 14 load 2 x 8' S14-12/A14 Lv3 10' S11  Lv3 10' S11  Lv3 12'          TABLE       SAQ Med/Small  roll 5 ct x15 Med/Small  roll 5 ct x15 Med/Small  roll 5 ct x15 ea Med/Small roll 20x5"   SLR 1 x10  Initiate w/QS 10x Initiate w/QS 15x Initiate w/QS 15x2"   Heel prop Small   X 3' Small roll 4' Small roll 5'  AAROM FLEX With sheet x10 reps With sheet 10x10" With strap 10x10" With strap 10x10"             STANDING                                                             PROPRIOCEPTION                                          MODALITIES See below See below See below                      Other Therapeutic Activities/Education:        Home Exercise Program: continue HEP  Per pre op instruction - patient encouraged to perform 50 SLR per day      Manual Treatments:  Passive heel cord stretch      Modalities: Patient received vasocompression on their L knee  for pain and inflammation for 15 min on low pressure. Patient had negative skin reaction afterwards.        Communication with other providers:        Assessment:  (Response towards treatment session) (Pain Rating)    Pt tolerated  treatment without any adverse reactions or complications this date. . Pt would continue to benefit from skilled therapy interventions to address remaining impairments, improve mobility and strength,  and progress toward  goal completion and prepare for d/c including finalizing HEP ; ..  Pain complaints after session 3-4/10 L knee      Plan for Next Session:  start high marches      Time In / Time Out:     1302/1347           Timed Code/Total Treatment Minutes:  45 15vaso 30te      Next Progress Note due:        Plan of Care Interventions:  [x]  Therapeutic Exercise  []  Modalities:  [x]  Therapeutic Activity     []  Ultrasound  [x]  Estim  []  Gait Training      []  Cervical Traction []  Lumbar Traction  [x]  Neuromuscular Re-education    [x]  Cold/hotpack []  Iontophoresis   [x]  Instruction in HEP      [x]  Vasopneumatic   []  Dry Needling    [x]  Manual Therapy               []  Aquatic Therapy              Electronically signed by:  07/29/2019, 1:02 PM

## 2019-07-29 NOTE — Telephone Encounter (Signed)
Spoke with patient to advise patient that medication was sent to [facility]. Voiced understanding

## 2019-07-31 ENCOUNTER — Inpatient Hospital Stay: Admit: 2019-07-31 | Payer: PRIVATE HEALTH INSURANCE | Primary: Family Medicine

## 2019-07-31 NOTE — Other (Signed)
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  Date of Injury/Surgery:   Treatment Diagnosis: Treatment Diagnosis: impaired mobility/ L knee stifness    Insurance/Certification information: PT Insurance Information: UMR   Referring Physician:  Referring Practitioner: [id number]  Next Doctor Visit:    Plan of care signed (Y/N):    Outcome Measure: LEFS 11/80  Visit# / total visits:  5/10 then PN  Pain level: 5/10 L knee  Goals:          Long term goals  Time Frame for Long term goals : 6 weeks 09/06/19  Long term goal 1: patient's goal increase L knee ROM and walk without assistive device  Long term goal 2: patient will score 40/80 indicating improved LE function  Long term goal 3: patient will have 115* L knee FLEX AROM    Summary of Re- Evaluation:  Patient primary complaints:  impaired mobility/ L knee stifness   History of condition:L TKA 4//8/21  Current functional limitations: ambulating with walker, needs assist with putting L shoe/ sock on; pain/ stiffness interferes with all ADL  Clinical findings: LEFS 11/80; : L knee AROM 65* exercise- knee EXT ROM -10* in supine; I SLR with lag  PLOF:employed @ 02-25-1974- ADL/IADL function only limited by l knee pain  Skilled PT interventions are intended 12/80 L knee ROM/ L leg strength which will enable patient  to return/ better manage chronic /improve quality of life  Patient agrees with established plan of care and assisted in the development of their  goals  Barriers to learning:none- no mental/cognitive barriers observed  Preferred learning style(s):   written- demonstration -practice  Preferred Language: English  Potential barriers to  progress:none  The patient appears motivated to participate in PT and regain PLOF: yes    Subjective:   Patient reports of 5/10 pain upon arrival and appears amb Indep. He does report of increased pain this morning amb with a more antalgic gait.        Any changes in Ambulatory Summary Sheet?  None        Objective:   AAROM Flex 117*  Ext -20* after prop       COVID screening questions were asked and patient attested that there had been no contact or symptoms        Exercises: (No more than 4 columns)   Exercise/Equipment Date 07/24/2019 07/29/2019 07/31/2019           WARM UP         nustep S11  Lv3 10' S11  Lv3 12' S10 Lv3 10'         TABLE      SAQ Med/Small  roll 5 ct x15 ea Med/Small roll 20x5" Med/Small roll 20x5"   SLR Initiate w/QS 15x Initiate w/QS 15x2" Initiate w/QS 15x2"   Heel prop Small roll 5'  Small roll 5'   AAROM FLEX With strap 10x10" With strap 10x10" With strap 10x10"   Quad set   10x10"  STANDING                                                     PROPRIOCEPTION                                    MODALITIES See below  See below                   Other Therapeutic Activities/Education:        Home Exercise Program: continue HEP  Per pre op instruction - patient encouraged to perform 50 SLR per day      Manual Treatments:  Passive heel cord stretch      Modalities: Patient received vasocompression on their L knee  for pain and inflammation for 10 min on low pressure. Patient had negative skin reaction afterwards.        Communication with other providers:        Assessment:  (Response towards treatment session) (Pain Rating)    Pt tolerated  treatment without any adverse reactions or complications this date. . Pt would continue to benefit from skilled therapy interventions to address remaining impairments, improve mobility and strength,  and progress toward goal completion and prepare for d/c including finalizing HEP ; ..  Pain complaints after session 5/10 L knee      Plan for  Next Session:  start high marches      Time In / Time Out:     1103/1141           Timed Code/Total Treatment Minutes:  38 10vaso 28te       Next Progress Note due:        Plan of Care Interventions:  [x]  Therapeutic Exercise  []  Modalities:  [x]  Therapeutic Activity     []  Ultrasound  [x]  Estim  []  Gait Training      []  Cervical Traction []  Lumbar Traction  [x]  Neuromuscular Re-education    [x]  Cold/hotpack []  Iontophoresis   [x]  Instruction in HEP      [x]  Vasopneumatic   []  Dry Needling    [x]  Manual Therapy               []  Aquatic Therapy              Electronically signed by:  Georgette Shell 07/31/2019, 11:03 AM

## 2019-08-03 ENCOUNTER — Inpatient Hospital Stay: Admit: 2019-08-03 | Payer: PRIVATE HEALTH INSURANCE | Primary: Family Medicine

## 2019-08-03 NOTE — Other (Signed)
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  Date of Injury/Surgery:   Treatment Diagnosis: Treatment Diagnosis: impaired mobility/ L knee stifness    Insurance/Certification information: PT Insurance Information: UMR   Referring Physician:  Referring Practitioner: Adline Potter  Next Doctor Visit:    Plan of care signed (Y/N):    Outcome Measure: LEFS 11/80  Visit# / total visits: 6/10 then PN  Pain level: 4/10 L knee  Goals:          Long term goals  Time Frame for Long term goals : 6 weeks 09/06/19  Long term goal 1: patient's goal increase L knee ROM and walk without assistive device  Long term goal 2: patient will score 40/80 indicating improved LE function  Long term goal 3: patient will have 115* L knee FLEX AROM    Summary of Re- Evaluation:  Patient primary complaints:  impaired mobility/ L knee stifness   History of condition:L TKA 4//8/21  Current functional limitations: ambulating with walker, needs assist with putting L shoe/ sock on; pain/ stiffness interferes with all ADL  Clinical findings: LEFS 11/80; : L knee AROM 65* exercise- knee EXT ROM -10* in supine; I SLR with lag  PLOF:employed @ [employer]- ADL/IADL function only limited by l knee pain  Skilled PT interventions are intended GU:[id number] L knee ROM/ L leg strength which will enable patient  to return/ better manage chronic /improve quality of life  Patient agrees with established plan of care and assisted in the development of their  goals  Barriers to learning:none- no mental/cognitive barriers observed  Preferred learning style(s):   written- demonstration -practice  Preferred Language: English  Potential barriers to  progress:none  The patient appears motivated to participate in PT and regain PLOF: yes    Subjective:   Patient reports of 4/10 pain upon arrival and appears amb Indep.  He reports he did little over the weekend, barely leaving his house.        Any changes in Ambulatory Summary Sheet?  None        Objective:   AAROM Flex 117*  Ext -20* after prop; patient ambulating without assistive device       COVID screening questions were asked and patient attested that there had been no contact or symptoms        Exercises: (No more than 4 columns)   Exercise/Equipment 07/31/2019 08/03/2019          WARM UP        nustep S10 Lv3 10' ------------   Bike  S9 Lv1 10'   TABLE     SAQ Med/Small roll 20x5" Small roll 20x5"   SLR Initiate w/QS 15x2" Initiate w/QS 15x2"   Heel prop Small roll 5' Small roll 5'   AAROM FLEX With strap 10x10" With strap 10x5"   Quad set 10x10" 10x10"                     STANDING     HC stretch on wedge  2x30"  High knee marches  3 laps   Hip ABD/Ext  2x10   Step ups  4" 2x10                                                PROPRIOCEPTION                              MODALITIES See below See below                 Other Therapeutic Activities/Education:        Home Exercise Program: continue HEP  Per pre op instruction - patient encouraged to perform 50 SLR per day      Manual Treatments:       Modalities: Patient received vasocompression on their L knee  for pain and inflammation for 10 min on low pressure. Patient had negative skin reaction afterwards.        Communication with other providers:        Assessment:  (Response towards treatment session) (Pain Rating)    Pt tolerated  treatment without any adverse reactions or complications this date. . Pt would continue to benefit from skilled therapy interventions to address remaining impairments, improve mobility and strength,  and progress toward goal completion and prepare for d/c including finalizing HEP ; ..progressing to PT STG for AROM of 20* and to  patient's goal of ambulating without assistive device  Pain complaints after session 4/10 L knee      Plan for Next Session:  start high marches      Time In / Time Out:     1301/1351           Timed Code/Total Treatment Minutes:   50 10vaso  15ta 25te      Next Progress Note due:        Plan of Care Interventions:  [x]  Therapeutic Exercise  []  Modalities:  [x]  Therapeutic Activity     []  Ultrasound  [x]  Estim  []  Gait Training      []  Cervical Traction []  Lumbar Traction  [x]  Neuromuscular Re-education    [x]  Cold/hotpack []  Iontophoresis   [x]  Instruction in HEP      [x]  Vasopneumatic   []  Dry Needling    [x]  Manual Therapy               []  Aquatic Therapy              Electronically signed by:  PT 08/03/2019, 1:01 PM

## 2019-08-04 ENCOUNTER — Encounter

## 2019-08-05 ENCOUNTER — Ambulatory Visit
Admit: 2019-08-05 | Discharge: 2019-08-05 | Payer: PRIVATE HEALTH INSURANCE | Attending: Physician Assistant | Primary: Family Medicine

## 2019-08-05 ENCOUNTER — Inpatient Hospital Stay: Admit: 2019-08-05 | Payer: PRIVATE HEALTH INSURANCE | Primary: Family Medicine

## 2019-08-05 DIAGNOSIS — Z96652 Presence of left artificial knee joint: Secondary | ICD-10-CM

## 2019-08-05 MED ORDER — OXYCODONE-ACETAMINOPHEN 5-325 MG PO TABS
5-325 MG | ORAL_TABLET | Freq: Three times a day (TID) | ORAL | 0 refills | Status: DC | PRN
Start: 2019-08-05 — End: 2019-08-11

## 2019-08-05 NOTE — Telephone Encounter (Signed)
Spoke with patient to inform patient of refill being sent [facility]. Voiced understanding.

## 2019-08-05 NOTE — Other (Signed)
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  Date of Injury/Surgery:   Treatment Diagnosis: Treatment Diagnosis: impaired mobility/ L knee stifness    Insurance/Certification information: PT Insurance Information: UMR   Referring Physician:  Referring Practitioner: [id number]  Next Doctor Visit:    Plan of care signed (Y/N):    Outcome Measure: LEFS 11/80  Visit# / total visits: 7/10 then PN  Pain level: 3/10 L knee  Goals:          Long term goals  Time Frame for Long term goals : 6 weeks 09/06/19  Long term goal 1: patient's goal increase L knee ROM and walk without assistive device  Long term goal 2: patient will score 40/80 indicating improved LE function  Long term goal 3: patient will have 115* L knee FLEX AROM    Summary of Re- Evaluation:  Patient primary complaints:  impaired mobility/ L knee stifness   History of condition:L TKA 4//8/21  Current functional limitations: ambulating with walker, needs assist with putting L shoe/ sock on; pain/ stiffness interferes with all ADL  Clinical findings: LEFS 11/80; : L knee AROM 65* exercise- knee EXT ROM -10* in supine; I SLR with lag  PLOF:employed @ 02-25-1974- ADL/IADL function only limited by l knee pain  Skilled PT interventions are intended 12/80 L knee ROM/ L leg strength which will enable patient  to return/ better manage chronic /improve quality of life  Patient agrees with established plan of care and assisted in the development of their  goals  Barriers to learning:none- no mental/cognitive barriers observed  Preferred learning style(s):   written- demonstration -practice  Preferred Language: English  Potential barriers to  progress:none  The patient appears motivated to participate in PT and regain PLOF: yes    Subjective:   Patient reports of 3/10 pain upon arrival and appears amb Indep.        Any changes in Ambulatory Summary Sheet?  None        Objective:   AAROM Flex 117*  Ext -15* after prop; patient ambulating without assistive device       COVID screening questions were asked and patient attested that there had been no contact or symptoms        Exercises: (No more than 4 columns)   Exercise/Equipment 07/31/2019 08/03/2019 08/05/2019           WARM UP         nustep S10 Lv3 10' ------------    Bike  S9 Lv1 10' S9 Lv1 10'   TABLE      SAQ Med/Small roll 20x5" Small roll 20x5" Small roll 20x5"   SLR Initiate w/QS 15x2" Initiate w/QS 15x2" Initiate w/QS 2x10   Heel prop Small roll 5' Small roll 5' Small roll 5'   AAROM FLEX With strap 10x10" With strap 10x5" With strap 10x5"   Quad set 10x10" 10x10" 10x10"  STANDING      HC stretch on wedge  2x30" 1'   High knee marches  3 laps 3 laps   Hip ABD/Ext  2x10 ea B 2x10 ea B   Step ups  4" 2x10 6" 2x10                                                        PROPRIOCEPTION                                    MODALITIES See below See below See below                   Other Therapeutic Activities/Education:        Home Exercise Program: continue HEP  Per pre op instruction - patient encouraged to perform 50 SLR per day      Manual Treatments:       Modalities: Patient received vasocompression on their L knee  for pain and inflammation for 10 min on low pressure. Patient had negative skin reaction afterwards.        Communication with other providers:        Assessment:  (Response towards treatment session) (Pain Rating)    Pt tolerated  treatment without any adverse reactions or complications this date. . Pt would continue to benefit from skilled therapy interventions to address remaining impairments, improve mobility and strength,  and progress toward goal completion and  prepare for d/c including finalizing HEP ; ..progressing to PT STG for AROM of 20* and to patient's goal of ambulating without assistive device  Pain complaints after session 4/10 L knee      Plan for Next Session:  start high marches      Time In / Time Out:     1304/1351           Timed Code/Total Treatment Minutes:  47  10vaso 37te      Next Progress Note due:        Plan of Care Interventions:  [x]  Therapeutic Exercise  []  Modalities:  [x]  Therapeutic Activity     []  Ultrasound  [x]  Estim  []  Gait Training      []  Cervical Traction []  Lumbar Traction  [x]  Neuromuscular Re-education    [x]  Cold/hotpack []  Iontophoresis   [x]  Instruction in HEP      [x]  Vasopneumatic   []  Dry Needling    [x]  Manual Therapy               []  Aquatic Therapy              Electronically signed by:  Georgette Shell  08/05/2019, 1:04 PM

## 2019-08-07 ENCOUNTER — Inpatient Hospital Stay: Admit: 2019-08-07 | Payer: PRIVATE HEALTH INSURANCE | Primary: Family Medicine

## 2019-08-07 NOTE — Discharge Summary (Signed)
Outpatient Physical Therapy           [place]           '[]'$  Phone: [phone]   Fax: [phone]  [facility]           '[x]'$  Phone: [phone]   Fax: [phone]      To: Referring Practitioner: Adline Potter                                        From: Susa Simmonds, PT             Patient: Scott Jacobs                                                        DOB: 12/03/1975  Diagnosis: Diagnosis: L TKA 4//8/21              Treatment Diagnosis: Treatment Diagnosis: impaired mobility/ L knee stifness   '[]'$   Progress Note                '[x]'$   Discharge Note    Evaluation Date: 07/08/19    Total Visits to date:   43 planned for up to 20 Cancels/No-shows to date:      Subjective:  08/25/19- last PT session:Patient reports of 4/10 pain upon arrival - c/o pain in the joint that has moved from the lateral knee to the medial knee esp going from sitting to standing.       Plan of Care/Treatment to date:  '[x]'$  Therapeutic Exercise    '[]'$  Modalities:  '[x]'$  Therapeutic Activity     '[]'$  Ultrasound  '[]'$  Electrical Stimulation  '[x]'$  Gait Training      '[]'$  Cervical Traction   '[]'$  Lumbar Traction  '[x]'$  Neuromuscular Re-education  '[]'$  Cold/hotpack '[]'$  Iontophoresis  '[x]'$  Instruction in HEP      Other:  '[x]'$  Manual Therapy       '[x]'$   Vasopneumatic  '[]'$  Aquatic Therapy       '[]'$    Dry Needle Therapy                      Objective/Significant Findings At Last Visit/Comments:13 STS in 30 sec with arms across chest        Assessment:          Goal Status:??????????????????????????????'[]'$ ????Achieved????????'[x]'$ ????Partially Achieved??????'[]'$ ????Not Achieved??????????????????  Long term goal 1: patient's goal increase L knee ROM and walk without assistive device-met  Long term goal 2: patient will score 40/80 indicating improved LE function-met  Long term goal 3: patient will have 115* L knee FLEX AROM-met  Changes to goals:????  ??????Long term goal 2: patient will score??55/80 indicating improved LE function-not assessed  Long term goal 3: patient will??complete 14 STS in 30 sec-not  met     '[x]'$  Patient now discharged-pr physician      Electronically signed by:  Susa Simmonds, PT, PT, 08/27/2019, 3:17 PM    If you have any questions or concerns, please don't hesitate to call.  Thank you for your referral.

## 2019-08-07 NOTE — Other (Signed)
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  Date of Injury/Surgery:   Treatment Diagnosis: Treatment Diagnosis: impaired mobility/ L knee stifness    Insurance/Certification information: PT Insurance Information: UMR   Referring Physician:  Referring Practitioner: Adline Potter  Next Doctor Visit:    Plan of care signed (Y/N):    Outcome Measure: LEFS 11/80  Visit# / total visits: 8/10 then PN  Pain level: 5/10 L knee  Goals:          Long term goals  Time Frame for Long term goals : 6 weeks 09/06/19  Long term goal 1: patient's goal increase L knee ROM and walk without assistive device  Long term goal 2: patient will score 40/80 indicating improved LE function  Long term goal 3: patient will have 115* L knee FLEX AROM    Summary of Re- Evaluation:  Patient primary complaints:  impaired mobility/ L knee stifness   History of condition:L TKA 4//8/21  Current functional limitations: ambulating with walker, needs assist with putting L shoe/ sock on; pain/ stiffness interferes with all ADL  Clinical findings: LEFS 11/80; : L knee AROM 65* exercise- knee EXT ROM -10* in supine; I SLR with lag  PLOF:employed @ [employer]- ADL/IADL function only limited by l knee pain  Skilled PT interventions are intended SW:NIOEVOJJ L knee ROM/ L leg strength which will enable patient  to return/ better manage chronic /improve quality of life  Patient agrees with established plan of care and assisted in the development of their  goals  Barriers to learning:none- no mental/cognitive barriers observed  Preferred learning style(s):   written- demonstration -practice  Preferred Language: English  Potential barriers to  progress:none  The patient appears motivated to participate in PT and regain PLOF: yes    Subjective:   Patient reports of 5/10 pain upon arrival - reports being on his feet more than usual yesterday.        Any changes in Ambulatory Summary Sheet?  None        Objective: 8 STS in 30 sec with arms across chest       COVID screening questions were asked and patient attested that there had been no contact or symptoms        Exercises: (No more than 4 columns)   Exercise/Equipment 07/31/2019 08/03/2019 08/05/2019 08/07/19            WARM UP          nustep S10 Lv3 10' ------------     Bike  S9 Lv1 10' S9 Lv1 10' S9 Lv1 10'   TABLE       SAQ Med/Small roll 20x5" Small roll 20x5" Small roll 20x5" Small roll 20x5"   SLR Initiate w/QS 15x2" Initiate w/QS 15x2" Initiate w/QS 2x10 stop   Heel prop Small roll 5' Small roll 5' Small roll 5' Small roll 7'   AAROM FLEX With strap 10x10" With strap 10x5" With strap 10x5" stop   Quad set 10x10" 10x10" 10x10" 10x10" and Combined with heel prop  Prone hang    1.5'                    STANDING       HC stretch on wedge  2x30" 1' Increase to 1.5'   High knee marches  3 laps 3 laps 1'   Hip ABD/Ext  2x10 ea B 2x10 ea B ---   Step ups  4" 2x10 6" 2x10 6' 1 x10 up with L/down with R   Rocker board taps    3 x10   STS    8STS in 30 sec   FWD step overs    4' L foot stays on step- R leg FWD  over steps and back    1x10                                           PROPRIOCEPTION                                          MODALITIES See below See below See below See below                     Interventions include dynamic and balance activities intended to improve patient's  functional performance during their necessary ADL/IADL.      Home Exercise Program: continue HEP  Per pre op instruction - patient encouraged to perform 50 SLR per day      Manual Treatments:       Modalities: Patient received vasocompression on their L knee  for pain and inflammation for 10 min on low pressure. Patient had  negative skin reaction afterwards.        Communication with other providers:        Assessment:  (Response towards treatment session) (Pain Rating)    Pt tolerated  treatment without any adverse reactions or complications this date. . Pt would continue to benefit from skilled therapy interventions to address remaining impairments, improve mobility and strength,  and progress toward goal completion and prepare for d/c including finalizing HEP ; ..  Pain complaints after session 5-6/10 L knee      Plan for Next Session:  start high marches      Time In / Time Out:     1342/1440           Timed Code/Total Treatment Minutes:  15' TA x1, 28' TE x2/  6'      Next Progress Note due:        Plan of Care Interventions:  [x]  Therapeutic Exercise  []  Modalities:  [x]  Therapeutic Activity     []  Ultrasound  [x]  Estim  []  Gait Training      []  Cervical Traction []  Lumbar Traction  [x]  Neuromuscular Re-education    [x]  Cold/hotpack []  Iontophoresis   [x]  Instruction in HEP      [x]  Vasopneumatic   []  Dry Needling    [x]  Manual Therapy               []  Aquatic Therapy              Electronically signed by:  Darianny Momon,PT  08/07/2019, 1:41 PM

## 2019-08-11 ENCOUNTER — Encounter

## 2019-08-11 ENCOUNTER — Inpatient Hospital Stay: Admit: 2019-08-11 | Payer: PRIVATE HEALTH INSURANCE | Primary: Family Medicine

## 2019-08-11 DIAGNOSIS — M25662 Stiffness of left knee, not elsewhere classified: Secondary | ICD-10-CM

## 2019-08-11 NOTE — Other (Signed)
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  Date of Injury/Surgery:   Treatment Diagnosis: Treatment Diagnosis: impaired mobility/ L knee stifness    Insurance/Certification information: PT Insurance Information: UMR   Referring Physician:  Referring Practitioner: [id number]  Next Doctor Visit:    Plan of care signed (Y/N):    Outcome Measure: LEFS 11/80  Visit# / total visits: 9/10 then PN  Pain level: 4/10 L knee  Goals:          Long term goals  Time Frame for Long term goals : 6 weeks 09/06/19  Long term goal 1: patient's goal increase L knee ROM and walk without assistive device  Long term goal 2: patient will score 40/80 indicating improved LE function  Long term goal 3: patient will have 115* L knee FLEX AROM    Summary of Re- Evaluation:  Patient primary complaints:  impaired mobility/ L knee stifness   History of condition:L TKA 4//8/21  Current functional limitations: ambulating with walker, needs assist with putting L shoe/ sock on; pain/ stiffness interferes with all ADL  Clinical findings: LEFS 11/80; : L knee AROM 65* exercise- knee EXT ROM -10* in supine; I SLR with lag  PLOF:employed @ 02-25-1974- ADL/IADL function only limited by l knee pain  Skilled PT interventions are intended 12/80 L knee ROM/ L leg strength which will enable patient  to return/ better manage chronic /improve quality of life  Patient agrees with established plan of care and assisted in the development of their  goals  Barriers to learning:none- no mental/cognitive barriers observed  Preferred learning style(s):   written- demonstration -practice  Preferred Language: English  Potential barriers to  progress:none  The patient appears motivated to participate in PT and regain PLOF: yes    Subjective:   Patient reports of 4/10 pain upon arrival - and c/o increased stiffness.       Any changes in Ambulatory Summary Sheet?  None        Objective: 11 STS in 30 sec with arms across chest    LEFS 42/80       COVID screening questions were asked and patient attested that there had been no contact or symptoms        Exercises: (No more than 4 columns)   Exercise/Equipment 08/05/2019 08/07/19 5/4/20212           WARM UP         nustep      Bike S9 Lv1 10' S9 Lv1 10' 10'   TABLE      SAQ Small roll 20x5" Small roll 20x5" Small roll 20x5"   Heel prop Small roll 5' Small roll 7' Small roll 7'   Quad set 10x10" 10x10" and Combined with heel prop 10x10" and Combined with heel prop   Prone hang  1.5' 1.5'                  STANDING      HC stretch on wedge 1' Increase to 1.5' 1.5'  High knee marches 3 laps 1' 1'   Step ups 6" 2x10 6' 1 x10 up with L/down with R 6' 20x   Rocker board taps  3 x10 3x10   STS  8STS in 30 sec 1x30sec   FWD step overs  4' L foot stays on step- R leg FWD  over steps and back    1x10 4' L foot stays on step- R leg FWD  over steps and back 20x                                      PROPRIOCEPTION                                    MODALITIES See below See below See below                   Interventions include dynamic and balance activities intended to improve patient's  functional performance during their necessary ADL/IADL.      Home Exercise Program: continue HEP  Per pre op instruction - patient encouraged to perform 50 SLR per day      Manual Treatments:       Modalities: Patient received vasocompression on their L knee  for pain and inflammation for 10 min on low pressure. Patient had negative skin reaction afterwards.        Communication with other providers:        Assessment:  (Response towards treatment session) (Pain Rating)    Pt tolerated  treatment without any adverse reactions or  complications this date. . Pt would continue to benefit from skilled therapy interventions to address remaining impairments, improve mobility and strength,  and progress toward goal completion and prepare for d/c including finalizing HEP ; ..  Pain complaints after session 5-6/10 L knee      Plan for Next Session:  start high marches      Time In / Time Out:     1108/1158           Timed Code/Total Treatment Minutes:  50  25te 15ta 10vaso      Next Progress Note due:        Plan of Care Interventions:  [x]  Therapeutic Exercise  []  Modalities:  [x]  Therapeutic Activity     []  Ultrasound  [x]  Estim  []  Gait Training      []  Cervical Traction []  Lumbar Traction  [x]  Neuromuscular Re-education    [x]  Cold/hotpack []  Iontophoresis   [x]  Instruction in HEP      [x]  Vasopneumatic   []  Dry Needling    [x]  Manual Therapy               []  Aquatic Therapy              Electronically signed by:  Georgette Shell  08/11/2019, 11:08 AM

## 2019-08-12 MED ORDER — OXYCODONE-ACETAMINOPHEN 5-325 MG PO TABS
5-325 MG | ORAL_TABLET | Freq: Three times a day (TID) | ORAL | 0 refills | Status: DC | PRN
Start: 2019-08-12 — End: 2019-08-18

## 2019-08-14 ENCOUNTER — Inpatient Hospital Stay: Admit: 2019-08-14 | Payer: PRIVATE HEALTH INSURANCE | Primary: Family Medicine

## 2019-08-14 NOTE — Other (Addendum)
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 Achieved    [] ? Partially Achieved   [] ? Not Achieved           Long term goal 1: patient's goal increase L knee ROM and walk without assistive device  Long term goal 2: patient will score 40/80 indicating improved LE function  Long term goal 3: patient will have 115* L knee FLEX AROM  Changes to goals:    ??  Long term goal 2: patient will score 55/80 indicating improved LE function  Long term goal 3: patient will complete 14 STS in 30 sec  ??Summary of Re- Evaluation:  Patient primary complaints:  impaired mobility/ L knee stifness   History of condition:L TKA 4//8/21  Current functional limitations: ambulating with walker, needs assist with putting L shoe/ sock on; pain/ stiffness interferes with all ADL  Clinical findings: LEFS 11/80; : L knee AROM 65* exercise- knee EXT ROM -10* in supine; I SLR with lag  PLOF:employed @ [employer]- ADL/IADL function only limited by l knee pain  Skilled PT interventions are intended L knee ROM/ L leg strength which will enable patient  to return/ better manage chronic /improve quality of life  Patient agrees with established plan of care and assisted  in the development of their  goals  Barriers to learning:none- no mental/cognitive barriers observed  Preferred learning style(s):   written- demonstration -practice  Preferred Language: English  Potential barriers to progress:none  The patient appears motivated to participate in PT and regain PLOF: yes    Subjective:   Patient reports of 6/10 pain upon arrival - has had more intense pain past 2 days.       Any changes in Ambulatory Summary Sheet?  None        Objective: 10 STS in 30 sec with arms across chest   LEFS 42/80       COVID screening questions were asked and patient attested that there had been no contact or symptoms        Exercises: (No more than 4 columns)   Exercise/Equipment 08/05/2019 08/07/19 5/4/20212 08/14/19            WARM UP          nustep       Bike S9 Lv1 10' S9 Lv1 10' 10' 10'   TABLE       SAQ Small roll 20x5" Small roll 20x5" Small roll  20x5"  FR 20x5"   Heel prop Small roll 5' Small roll 7' Small roll 7' FR x9'   Quad set 10x10" 10x10" and Combined with heel prop 10x10" and Combined with heel prop 10x10" and Combined with heel prop   Prone hang  1.5' 1.5'    LAQ    5 ct x 10             STANDING       HC stretch on wedge 1' Increase to 1.5' 1.5' 1.5'   High knee marches 3 laps 1' 1' 1'   Step ups 6" 2x10 6' 1 x10 up with L/down with R 6' 20x 6' with R KTC 2 x10   Rocker board taps  3 x10 3x10 3x10   STS  8STS in 30 sec 1x30sec 1x30sec   FWD step overs  4' L foot stays on step- R leg FWD  over steps and back    1x10 4' L foot stays on step- R leg FWD  over steps and back 20x --                                           PROPRIOCEPTION                                          MODALITIES See below See below See below declined                     Interventions include dynamic and balance activities intended to improve patient's  functional performance during their necessary ADL/IADL.      Home Exercise Program: continue HEP  Per pre op instruction - patient encouraged to perform 50 SLR per  day      Manual Treatments:       Modalities: Patient received vasocompression on their L knee  for pain and inflammation for 10 min on low pressure. Patient had negative skin reaction afterwards.        Communication with other providers:        Assessment:  (Response towards treatment session) (Pain Rating)    Pt tolerated  treatment without any adverse reactions or complications this date. . Pt would continue to benefit from skilled therapy interventions to address remaining impairments, improve mobility and strength,  and progress toward goal completion and prepare for d/c including finalizing HEP ; ..  Pain complaints after session 5-6/10 L knee      Plan for Next Session:  start high marches      Time In / Time Out:     1108/1156           Timed Code/Total Treatment Minutes:  38  23te 15ta       Next Progress Note due:        Plan of Care Interventions:  [x]  Therapeutic Exercise  []  Modalities:  [x]  Therapeutic Activity     []  Ultrasound  [x]  Estim  []  Gait Training      []  Cervical Traction []  Lumbar Traction  [x]  Neuromuscular Re-education    [x]  Cold/hotpack []  Iontophoresis   [x]  Instruction in HEP      [x]  Vasopneumatic   []  Dry Needling    [x]  Manual Therapy               []   Aquatic Therapy              Electronically signed by:  Marisa Cyphers  08/14/2019, 11:20 AM

## 2019-08-14 NOTE — Progress Notes (Signed)
Outpatient Physical Therapy           [place]           []  Phone: [phone]   Fax: [phone]  [facility]           [x]  Phone: [phone]   Fax: [phone]      To: Referring Practitioner: Adline Potter                                        From: Susa Simmonds, PT             Patient: Scott Jacobs                                                        DOB: 1975/10/15  Diagnosis: Diagnosis: L TKA 4//8/21              Treatment Diagnosis: Treatment Diagnosis: impaired mobility/ L knee stifness       [x]   Progress Note                []   Discharge Note    Evaluation Date: 07/21/19    Total Visits to date: 10   Cancels/No-shows to date:      Subjective:  Patient reports mobility of L knee has improved since surgery however knee will feel stiff @ times and pain will increase if patient is too active      Plan of Care/Treatment to date:  [x]  Therapeutic Exercise    []  Modalities:  [x]  Therapeutic Activity     []  Ultrasound  []  Electrical Stimulation  [x]  Gait Training      []  Cervical Traction   []  Lumbar Traction  [x]  Neuromuscular Re-education  []  Cold/hotpack []  Iontophoresis  [x]  Instruction in HEP      Other:  [x]  Manual Therapy       [x]   Vasopneumatic  []  Aquatic Therapy       []    Dry Needle Therapy                      Objective/Significant Findings At Last Visit/Comments:  Patient able to perform 10- 11 STS in 30 sec with arms across chest most recent   LEFS 42/80; L knee FLEX up to 117*      Assessment:         Goal Status:  [x]  Achieved []  Partially Achieved  []  Not Achieved   Long term goal 1: patient's goal increase L knee ROM and walk without assistive device  Long term goal 2: patient will score 40/80 indicating improved LE function  Long term goal 3: patient will have 115* L knee FLEX AROM  Changes to goals:        Long term goal 2: patient will score 55/80 indicating improved LE function  Long term goal 3: patient will complete 14 STS in 30 sec           Frequency/Duration:  # Days per  week: []  1 day # Weeks: []  1 week [x]  4 weeks []  8 weeks     [x]  2 days   []  2 weeks []  5 weeks []  10 weeks     [x]  3 days   []   3 weeks []  6 weeks []  12 weeks       Rehab Potential: []  Excellent [x]  Good []  Fair  []  Poor         Patient Status: []  Continue per initial plan of Care     []  Patient now discharged     []  Additional visits requested, Please re-certify for additional visits:      Requested frequency/duration: 2-3/week for4weeks    If we are requesting more visits, we fully anticipate the patient's condition is expected to improve within the treatment timeframe we are requesting.    Electronically signed by:  , PT, 08/14/2019, 4:48 PM    If you have any questions or concerns, please don't hesitate to call.  Thank you for your referral.    Physician Signature:______________________ Date:______ Time: ________  By signing above, therapist???s plan is approved by physician

## 2019-08-17 ENCOUNTER — Inpatient Hospital Stay: Admit: 2019-08-17 | Payer: PRIVATE HEALTH INSURANCE | Primary: Family Medicine

## 2019-08-17 NOTE — Other (Signed)
Outpatient Physical Therapy  [place]           []  Phone: [phone]   Fax: [phone]  [facility]           [x]  Phone: [phone]   Fax: [phone]        Physical Therapy Daily Treatment Note  Date:  08/17/2019    Patient Name:  Scott Jacobs    DOB:  Dec 15, 1975  MRN: [id number]  Restrictions/Precautions:  WBAT L  Diagnosis:   Diagnosis: L TKA 4//8/21  Date of Injury/Surgery:   Treatment Diagnosis: Treatment Diagnosis: impaired mobility/ L knee stifness    Insurance/Certification information: PT Insurance Information: UMR   Referring Physician:  Referring Practitioner: Adline Potter  Next Doctor Visit:    Plan of care signed (Y/N):    Outcome Measure: LEFS 11/80  Visit# / total visits: 11/20 then PN- plan expires 09/14/19  Pain level: 5/10 L knee  Goals:          Long term goals  Goal Status:               [x] ? Achieved    [] ? Partially Achieved   [] ? Not Achieved           Long term goal 1: patient's goal increase L knee ROM and walk without assistive device  Long term goal 2: patient will score 40/80 indicating improved LE function  Long term goal 3: patient will have 115* L knee FLEX AROM  Changes to goals:    ??  Long term goal 2: patient will score 55/80 indicating improved LE function  Long term goal 3: patient will complete 14 STS in 30 sec  ??Summary of Re- Evaluation:  Patient primary complaints:  impaired mobility/ L knee stifness   History of condition:L TKA 4//8/21  Current functional limitations: ambulating with walker, needs assist with putting L shoe/ sock on; pain/ stiffness interferes with all ADL  Clinical findings: LEFS 11/80; : L knee AROM 65* exercise- knee EXT ROM -10* in supine; I SLR with lag  PLOF:employed @ [employer]- ADL/IADL function only limited by l knee pain  Skilled PT interventions are intended NA:TFTDDUKG L knee ROM/ L leg strength which will enable patient  to return/ better manage chronic /improve quality of life  Patient agrees with established plan of care and assisted  in the development of their  goals  Barriers to learning:none- no mental/cognitive barriers observed  Preferred learning style(s):   written- demonstration -practice  Preferred Language: English  Potential barriers to progress:none  The patient appears motivated to participate in PT and regain PLOF: yes    Subjective:   Patient reports of 5/10 pain upon arrival - has had more intense pain since his last treatment in the joint, he feels from the STS.       Any changes in Ambulatory Summary Sheet?  None        Objective: 12 STS in 30 sec with arms across chest         COVID screening questions were asked and patient attested that there had been no contact or symptoms        Exercises: (No more than 4 columns)   Exercise/Equipment 5/4/20212 08/14/19 08/17/2019           WARM UP         nustep      Bike 10' 10' S9 10'   TABLE      SAQ Small roll 20x5"  FR 20x5" FR 20x5"  Heel prop Small roll 7' FR x9' FR x9'   Quad set 10x10" and Combined with heel prop 10x10" and Combined with heel prop 10x10" and Combined with heel prop   Prone hang 1.5'     LAQ  5 ct x 10 10x5"            STANDING      HC stretch on wedge 1.5' 1.5' 1.5'   High knee marches 1' 1' 1'   Step ups 6' 20x 6' with R KTC 2 x10 6' with R KTC 2 x10   Rocker board taps 3x10 3x10 BOSU 1'   STS 1x30sec 1x30sec 1x30"   FWD step overs 4' L foot stays on step- R leg FWD  over steps and back 20x -- --                                      PROPRIOCEPTION                                    MODALITIES See below declined declined                   Interventions include dynamic and balance activities intended to improve patient's  functional performance during their necessary ADL/IADL.      Home Exercise Program: continue HEP  Per pre op instruction - patient encouraged to perform 50 SLR per day      Manual Treatments:       Modalities: Patient received vasocompression on their L knee  for pain and inflammation for 10 min on low pressure. Patient had negative skin reaction  afterwards. - DECLINED       Communication with other providers:        Assessment:  (Response towards treatment session) (Pain Rating)    Pt tolerated  treatment without any adverse reactions or complications this date. . Pt would continue to benefit from skilled therapy interventions to address remaining impairments, improve mobility and strength,  and progress toward goal completion and prepare for d/c including finalizing HEP ; ..  Pain complaints after session 4/10 L knee      Plan for Next Session:  start high marches      Time In / Time Out:     1113/1148           Timed Code/Total Treatment Minutes:  35te        Next Progress Note due:        Plan of Care Interventions:  [x]  Therapeutic Exercise  []  Modalities:  [x]  Therapeutic Activity     []  Ultrasound  [x]  Estim  []  Gait Training      []  Cervical Traction []  Lumbar Traction  [x]  Neuromuscular Re-education    [x]  Cold/hotpack []  Iontophoresis   [x]  Instruction in HEP      [x]  Vasopneumatic   []  Dry Needling    [x]  Manual Therapy               []  Aquatic Therapy              Electronically signed by:   08/17/2019, 11:13 AM

## 2019-08-18 ENCOUNTER — Encounter

## 2019-08-19 MED ORDER — OXYCODONE-ACETAMINOPHEN 5-325 MG PO TABS
5-325 MG | ORAL_TABLET | Freq: Three times a day (TID) | ORAL | 0 refills | Status: AC | PRN
Start: 2019-08-19 — End: 2019-08-26

## 2019-08-20 ENCOUNTER — Inpatient Hospital Stay: Admit: 2019-08-20 | Payer: PRIVATE HEALTH INSURANCE | Primary: Family Medicine

## 2019-08-20 NOTE — Other (Signed)
Outpatient Physical Therapy  [place]           []  Phone: [phone]   Fax: [phone]  [facility]           [x]  Phone: [phone]   Fax: [phone]        Physical Therapy Daily Treatment Note  Date:  08/20/2019    Patient Name:  Scott Jacobs    DOB:  04-24-1975  MRN: [id number]  Restrictions/Precautions:  WBAT L  Diagnosis:   Diagnosis: L TKA 4//8/21  Date of Injury/Surgery:   Treatment Diagnosis: Treatment Diagnosis: impaired mobility/ L knee stifness    Insurance/Certification information: PT Insurance Information: UMR   Referring Physician:  Referring Practitioner: Adline Potter  Next Doctor Visit:    Plan of care signed (Y/N):    Outcome Measure: LEFS 11/80  Visit# / total visits: 12/20 then PN- plan expires 09/14/19  Pain level: 4/10 L knee  Goals:          Long term goals  Goal Status:               [x] ? Achieved    [] ? Partially Achieved   [] ? Not Achieved           Long term goal 1: patient's goal increase L knee ROM and walk without assistive device  Long term goal 2: patient will score 40/80 indicating improved LE function  Long term goal 3: patient will have 115* L knee FLEX AROM  Changes to goals:    ??  Long term goal 2: patient will score 55/80 indicating improved LE function  Long term goal 3: patient will complete 14 STS in 30 sec  ??Summary of Re- Evaluation:  Patient primary complaints:  impaired mobility/ L knee stifness   History of condition:L TKA 4//8/21  Current functional limitations: ambulating with walker, needs assist with putting L shoe/ sock on; pain/ stiffness interferes with all ADL  Clinical findings: LEFS 11/80; : L knee AROM 65* exercise- knee EXT ROM -10* in supine; I SLR with lag  PLOF:employed @ [employer]- ADL/IADL function only limited by l knee pain  Skilled PT interventions are intended NL:ZJQBHALP L knee ROM/ L leg strength which will enable patient  to return/ better manage chronic /improve quality of life  Patient agrees with established plan of care and assisted  in the development of their  goals  Barriers to learning:none- no mental/cognitive barriers observed  Preferred learning style(s):   written- demonstration -practice  Preferred Language: English  Potential barriers to progress:none  The patient appears motivated to participate in PT and regain PLOF: yes    Subjective:   Patient reports of 4/10 pain upon arrival - c/o pain in the joint that has moved from the lateral knee to the medial knee esp going from sitting to standing.     Any changes in Ambulatory Summary Sheet?  None        Objective: 14 STS in 30 sec with arms across chest         COVID screening questions were asked and patient attested that there had been no contact or symptoms        Exercises: (No more than 4 columns)   Exercise/Equipment 08/17/2019 08/20/2019          WARM UP        nustep     Bike S9 10' S9 10'   TABLE     SAQ FR 20x5" FR 20x5"   Heel prop FR x9' FR 9'  Quad set 10x10" and Combined with heel prop 10x10" and Combined with heel prop   Prone hang     LAQ 10x5" 20x5"           STANDING     HC stretch on wedge 1.5' 1.5'   High knee marches 1' 1.5'   Step ups 6' with R KTC 2 x10 6' with R KTC 2 x10   Rocker board taps BOSU 1' BOSU 1.5'   STS 1x30" 1x30"   FWD step overs --                                  PROPRIOCEPTION                              MODALITIES declined                  Interventions include dynamic and balance activities intended to improve patient's  functional performance during their necessary ADL/IADL.      Home Exercise Program: continue HEP  Per pre op instruction - patient encouraged to perform 50 SLR per day      Manual Treatments:       Modalities: Patient received vasocompression on their L knee  for pain and inflammation for 10 min on low pressure. Patient had negative skin reaction afterwards. - DECLINED       Communication with other providers:        Assessment:  (Response towards treatment session) (Pain Rating)    Pt tolerated  treatment without any adverse  reactions or complications this date. . Pt would continue to benefit from skilled therapy interventions to address remaining impairments, improve mobility and strength,  and progress toward goal completion and prepare for d/c including finalizing HEP ; ..  Pain complaints after session 4/10 L knee      Plan for Next Session:  start high marches      Time In / Time Out:     1109/1150           Timed Code/Total Treatment Minutes:  41  10vaso 31te         Next Progress Note due:        Plan of Care Interventions:  [x]  Therapeutic Exercise  []  Modalities:  [x]  Therapeutic Activity     []  Ultrasound  [x]  Estim  []  Gait Training      []  Cervical Traction []  Lumbar Traction  [x]  Neuromuscular Re-education    [x]  Cold/hotpack []  Iontophoresis   [x]  Instruction in HEP      [x]  Vasopneumatic   []  Dry Needling    [x]  Manual Therapy               []  Aquatic Therapy              Electronically signed by:   08/20/2019, 11:09 AM

## 2019-08-25 ENCOUNTER — Encounter

## 2019-08-25 ENCOUNTER — Inpatient Hospital Stay: Admit: 2019-08-25 | Payer: PRIVATE HEALTH INSURANCE | Primary: Family Medicine

## 2019-08-25 NOTE — Other (Signed)
Outpatient Physical Therapy  [place]           []  Phone: [phone]   Fax: [phone]  [facility]           [x]  Phone: [phone]   Fax: [phone]        Physical Therapy Daily Treatment Note  Date:  08/25/2019    Patient Name:  Scott Jacobs    DOB:  1975/11/17  MRN: [id number]  Restrictions/Precautions:  WBAT L  Diagnosis:   Diagnosis: L TKA 4//8/21  Date of Injury/Surgery:   Treatment Diagnosis: Treatment Diagnosis: impaired mobility/ L knee stifness    Insurance/Certification information: PT Insurance Information: UMR   Referring Physician:  Referring Practitioner: Adline Potter  Next Doctor Visit:    Plan of care signed (Y/N):    Outcome Measure: LEFS 11/80  Visit# / total visits: 13/20 then PN- plan expires 09/14/19  Pain level: 4/10 L knee  Goals:          Long term goals  Goal Status:               [x] ? Achieved    [] ? Partially Achieved   [] ? Not Achieved           Long term goal 1: patient's goal increase L knee ROM and walk without assistive device  Long term goal 2: patient will score 40/80 indicating improved LE function  Long term goal 3: patient will have 115* L knee FLEX AROM  Changes to goals:    ??  Long term goal 2: patient will score 55/80 indicating improved LE function  Long term goal 3: patient will complete 14 STS in 30 sec  ??Summary of Re- Evaluation:  Patient primary complaints:  impaired mobility/ L knee stifness   History of condition:L TKA 4//8/21  Current functional limitations: ambulating with walker, needs assist with putting L shoe/ sock on; pain/ stiffness interferes with all ADL  Clinical findings: LEFS 11/80; : L knee AROM 65* exercise- knee EXT ROM -10* in supine; I SLR with lag  PLOF:employed @ [employer]- ADL/IADL function only limited by l knee pain  Skilled PT interventions are intended TF:TDDUKGUR L knee ROM/ L leg strength which will enable patient  to return/ better manage chronic /improve quality of life  Patient agrees with established plan of care and assisted  in the development of their  goals  Barriers to learning:none- no mental/cognitive barriers observed  Preferred learning style(s):   written- demonstration -practice  Preferred Language: English  Potential barriers to progress:none  The patient appears motivated to participate in PT and regain PLOF: yes    Subjective:   Patient reports of 4/10 pain upon arrival - c/o pain in the joint that has moved from the lateral knee to the medial knee esp going from sitting to standing.     Any changes in Ambulatory Summary Sheet?  None        Objective: 13 STS in 30 sec with arms across chest       COVID screening questions were asked and patient attested that there had been no contact or symptoms        Exercises: (No more than 4 columns)   Exercise/Equipment 08/20/2019 08/25/2019          WARM UP        nustep     Bike S9 10' S8 10'   TABLE     SAQ FR 20x5" FR 20x5"   Heel prop FR 9' FR 9' 3#  Quad set 10x10" and Combined with heel prop 10x10" and Combined with heel prop   Prone hang     LAQ 20x5" 20x5"           STANDING     HC stretch on wedge 1.5' 1.5'   High knee marches 1.5' 1.5'   Step ups 6' with R KTC 2 x10 6' with R KTC 2 x10   Rocker board taps BOSU 1.5' BOSU 1.5'   STS 1x30" 1x30"   FWD step overs                                   PROPRIOCEPTION                              MODALITIES                   Interventions include dynamic and balance activities intended to improve patient's  functional performance during their necessary ADL/IADL.      Home Exercise Program: continue HEP  Per pre op instruction - patient encouraged to perform 50 SLR per day      Manual Treatments:       Modalities: Patient received vasocompression on their L knee  for pain and inflammation for 10 min on low pressure. Patient had negative skin reaction afterwards. - DECLINED       Communication with other providers:        Assessment:  (Response towards treatment session) (Pain Rating)    Pt tolerated  treatment without any adverse  reactions or complications this date. . Pt would continue to benefit from skilled therapy interventions to address remaining impairments, improve mobility and strength,  and progress toward goal completion and prepare for d/c including finalizing HEP ; ..  Pain complaints after session 3/10 L knee      Plan for Next Session:  start high marches      Time In / Time Out:     1118/1200           Timed Code/Total Treatment Minutes:   42te       Next Progress Note due:        Plan of Care Interventions:  [x]  Therapeutic Exercise  []  Modalities:  [x]  Therapeutic Activity     []  Ultrasound  [x]  Estim  []  Gait Training      []  Cervical Traction []  Lumbar Traction  [x]  Neuromuscular Re-education    [x]  Cold/hotpack []  Iontophoresis   [x]  Instruction in HEP      [x]  Vasopneumatic   []  Dry Needling    [x]  Manual Therapy               []  Aquatic Therapy              Electronically signed by:   08/25/2019, 11:18 AM

## 2019-08-26 ENCOUNTER — Ambulatory Visit: Admit: 2019-08-26 | Discharge: 2019-08-31 | Payer: PRIVATE HEALTH INSURANCE | Primary: Family Medicine

## 2019-08-26 ENCOUNTER — Ambulatory Visit
Admit: 2019-08-26 | Discharge: 2019-08-26 | Payer: PRIVATE HEALTH INSURANCE | Attending: Orthopaedic Surgery | Primary: Family Medicine

## 2019-08-26 DIAGNOSIS — Z09 Encounter for follow-up examination after completed treatment for conditions other than malignant neoplasm: Secondary | ICD-10-CM

## 2019-08-26 DIAGNOSIS — Z96652 Presence of left artificial knee joint: Secondary | ICD-10-CM

## 2019-08-27 ENCOUNTER — Encounter: Primary: Family Medicine

## 2019-09-01 ENCOUNTER — Encounter: Primary: Family Medicine

## 2019-09-03 ENCOUNTER — Encounter: Primary: Family Medicine

## 2019-09-08 ENCOUNTER — Encounter: Primary: Family Medicine

## 2019-09-10 ENCOUNTER — Encounter: Primary: Family Medicine

## 2019-10-06 ENCOUNTER — Encounter

## 2019-10-07 ENCOUNTER — Ambulatory Visit: Admit: 2019-10-07 | Payer: PRIVATE HEALTH INSURANCE | Primary: Family Medicine

## 2019-10-07 ENCOUNTER — Ambulatory Visit
Admit: 2019-10-07 | Discharge: 2019-10-07 | Payer: PRIVATE HEALTH INSURANCE | Attending: Orthopaedic Surgery | Primary: Family Medicine

## 2019-10-07 DIAGNOSIS — Z96652 Presence of left artificial knee joint: Secondary | ICD-10-CM

## 2019-10-07 DIAGNOSIS — Z09 Encounter for follow-up examination after completed treatment for conditions other than malignant neoplasm: Secondary | ICD-10-CM

## 2020-01-04 MED ORDER — LEVOTHYROXINE SODIUM 112 MCG PO TABS
112 MCG | ORAL_TABLET | ORAL | 3 refills | Status: AC
Start: 2020-01-04 — End: ?

## 2020-06-15 ENCOUNTER — Encounter

## 2020-06-22 ENCOUNTER — Ambulatory Visit
Admit: 2020-06-22 | Discharge: 2020-06-22 | Payer: PRIVATE HEALTH INSURANCE | Attending: Orthopaedic Surgery | Primary: Family Medicine

## 2020-06-22 ENCOUNTER — Ambulatory Visit: Admit: 2020-06-22 | Payer: PRIVATE HEALTH INSURANCE | Primary: Family Medicine

## 2020-06-22 DIAGNOSIS — Z09 Encounter for follow-up examination after completed treatment for conditions other than malignant neoplasm: Secondary | ICD-10-CM

## 2020-06-22 DIAGNOSIS — Z96652 Presence of left artificial knee joint: Secondary | ICD-10-CM

## 2020-06-22 NOTE — Progress Notes (Signed)
Patient returns for a 1 year post op of a left tka. Pt stated he is overall doing very well and no complaints with the knee. Pt stated he will have occasional popping and cracking but is not painful. Pt denies any falls.

## 2020-06-22 NOTE — Patient Instructions (Signed)
Continue weight-bearing as tolerated.  Continue range of motion exercises as instructed.  Ice and elevate as needed.  Tylenol or Motrin for pain.  Follow up in 5 years.

## 2020-06-23 NOTE — Progress Notes (Signed)
Subjective:      Patient ID: Scott Jacobs is a 45 y.o. male.    Patient returns for a 1 year post op of a left tka. Pt stated he is overall doing very well and no complaints with the knee. Pt stated he will have occasional popping and cracking but is not painful. Pt denies any falls.    He comes in today for his 1 year postop recheck.  Overall he states that he is feeling great and since I saw him last he has not had any pain or issues with his left knee.  Patient denies any new injury to the involved extremity/ joint, denies numbness or tingling in the involved extremity and denies fever or chills.            Knee Pain   Pertinent negatives include no numbness.       Review of Systems   Constitutional: Negative for activity change, chills and fever.   Respiratory: Negative for chest tightness.    Cardiovascular: Negative for chest pain.   Musculoskeletal: Negative for arthralgias, back pain, gait problem, joint swelling and myalgias.   Skin: Negative for color change, pallor, rash and wound.   Neurological: Negative for weakness and numbness.       Past Medical History:   Diagnosis Date   ??? Arthritis     knees   ??? Hypothyroidism    ??? Knee pain    ??? Obesity        Objective:   Physical Exam  Constitutional:       Appearance: He is well-developed.   HENT:      Head: Normocephalic.   Eyes:      Pupils: Pupils are equal, round, and reactive to light.   Pulmonary:      Effort: Pulmonary effort is normal.   Musculoskeletal:         General: No swelling, tenderness or deformity. Normal range of motion.      Cervical back: Normal range of motion.      Right hip: Normal.      Left hip: Normal.      Right knee: No swelling, deformity, effusion, erythema, ecchymosis, lacerations or bony tenderness. Normal range of motion. No tenderness. No medial joint line, lateral joint line, MCL, LCL or patellar tendon tenderness. No LCL laxity or MCL laxity. Normal alignment and normal patellar mobility.      Left knee: No  swelling, deformity, effusion, erythema, ecchymosis, lacerations or bony tenderness. Normal range of motion. No tenderness. No MCL or LCL tenderness. No LCL laxity or MCL laxity.Normal alignment and normal patellar mobility.   Skin:     General: Skin is warm and dry.      Capillary Refill: Capillary refill takes less than 2 seconds.      Coloration: Skin is not pale.      Findings: No erythema or rash.   Neurological:      Mental Status: He is alert and oriented to person, place, and time.     Left knee-Incision clean, dry, intact, with no erythema, no drainage, and no signs of infection.  0-130    XR KNEE LEFT (3 VIEWS)    Result Date: 06/22/2020  XRAY X-ray 3 views of the left knee obtained and reviewed by me today in the office demonstrates age appropriate bone density throughout with well-positioned left total knee arthroplasty, there has been no change in position of components compared to prior x-rays, no signs of loosening or  wear, normal tracking patella, there remains an implanted metallic screw in the distal femur from prior ACL reconstruction, no acute osseous abnormalities. Impression: Stable left total knee arthroplasty with no acute process.               Assessment:      Left TKA, 1 year      Plan:       I discussed with him today his x-ray findings.  I explained to him that his implants are stable and remain in good alignment.  I discussed with him today that he is continuing to progress extremely well.  Continue weight-bearing as tolerated.  Continue range of motion exercises as instructed.  Ice and elevate as needed.  Tylenol or Motrin for pain.  Follow up in 5 years for recheck with x-rays of the left knee.              Mitsy Owen, DO

## 2020-07-06 ENCOUNTER — Encounter: Attending: Orthopaedic Surgery | Primary: Family Medicine

## 2021-08-22 NOTE — Telephone Encounter (Signed)
Left message on voicemail to return call and let us know if patient is still seeing Sharee Pimple or if he has moved to [place].

## 2023-02-19 ENCOUNTER — Inpatient Hospital Stay: Admit: 2023-02-19 | Payer: BLUE CROSS/BLUE SHIELD

## 2023-02-19 DIAGNOSIS — D806 Antibody deficiency with near-normal immunoglobulins or with hyperimmunoglobulinemia: Secondary | ICD-10-CM

## 2023-02-22 ENCOUNTER — Ambulatory Visit: Admit: 2023-02-22 | Payer: BLUE CROSS/BLUE SHIELD

## 2023-02-22 DIAGNOSIS — D806 Antibody deficiency with near-normal immunoglobulins or with hyperimmunoglobulinemia: Secondary | ICD-10-CM

## 2023-06-21 ENCOUNTER — Ambulatory Visit: Admit: 2023-06-21 | Discharge: 2017-09-26 | Payer: Medicare (Managed Care)

## 2023-06-21 MED ORDER — FEXOFENADINE HCL 180 MG OR TABS
180 | ORAL_TABLET | Freq: Every day | ORAL | 11 refills | Status: AC | PRN
Start: 2023-06-21 — End: ?

## 2023-07-29 IMAGING — MR MULTI PARAMETRIC MRI PROSTATE W/O AND W CONTRAST
12 series · 37 of 48 positions shown · IV contrast (gadavist)
Comparison: None

________________________________________________________________________________________________ 
MULTI PARAMETRIC MRI PROSTATE W/O AND W CONTRAST, 07/29/2023 [DATE]: 
CLINICAL INDICATION: Elevated Prostate Specific Antigen [psa]
TECHNIQUE: Multiple parametric sequences were performed Pre-contrast: T1 axial 
of the entire pelvis. T2 sagittal, axial  and coronal,T1 axial, acquired of the 
prostate. Diffusion with multiple B values of 0444, 7111 calculated ADC value 
for mapping. Post contrast: Rapid sequence dynamic and axial planes through the 
prostate and seminal vesicles,T1 axial with fat sat of the entire pelvis. 3-D 
renderings were reconstructed on an independent workstation. The images were 
also evaluated with Dyna CAD computer aided detection. 15 mL of Gadavist were 
injected intravenously. As per [HOSPITAL] guidelines 3D 
reconstructions are performed with concurrent physician supervision.

[Series 101: survey · axial · 15.0mm · 1.76mm/px · z∈[-25,+224]mm · 2 of 11 slices shown]
[im 1/11]
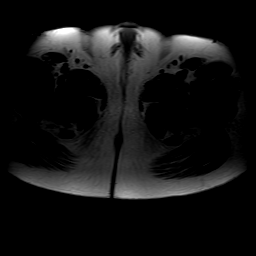
[im 11/11]
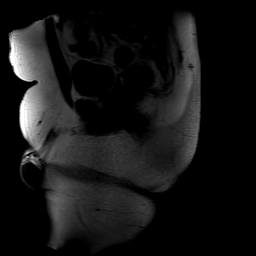

[Series 201: t1w_tse_ax · axial · 5.0mm · 0.64mm/px · z∈[-25,+227]mm · 2 of 43 slices shown]
[im 1/43]
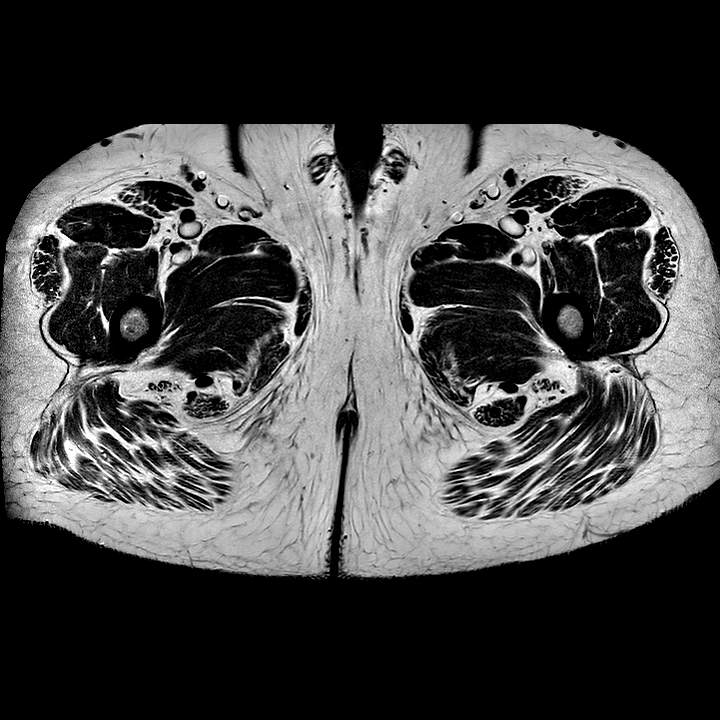
[im 43/43]
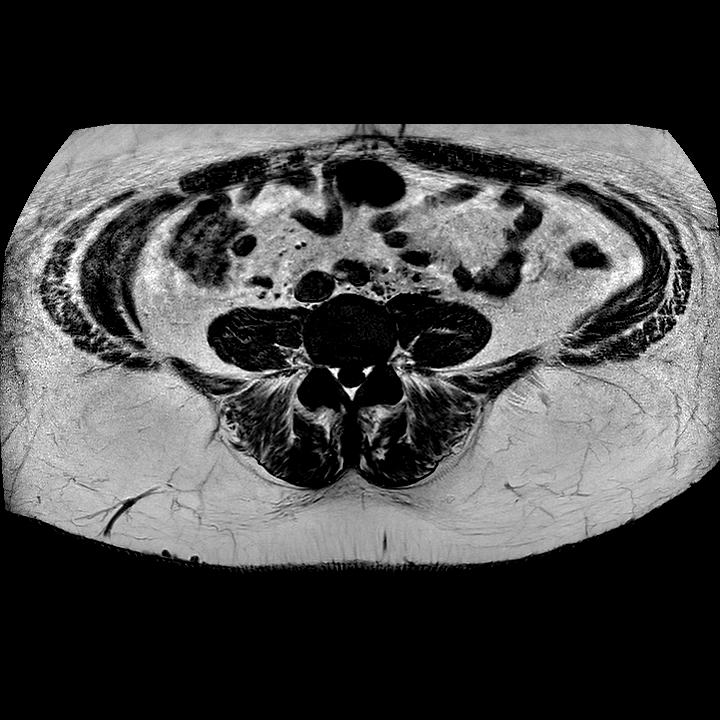

[Series 301: t2w sag · sagittal · 5.0mm · 0.51mm/px · 1 of 14 slices shown]
[im 1/14]
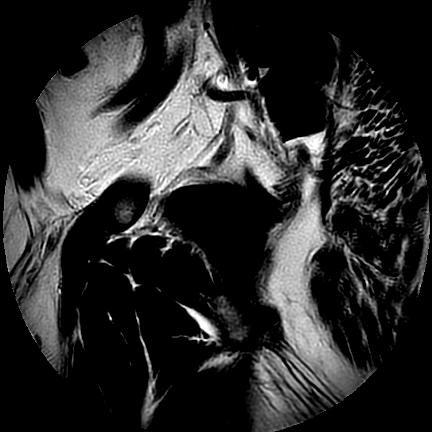

[Series 401: t2w ax · axial · 3.0mm · 0.43mm/px · 1 of 23 slices shown]
[im 1/23]
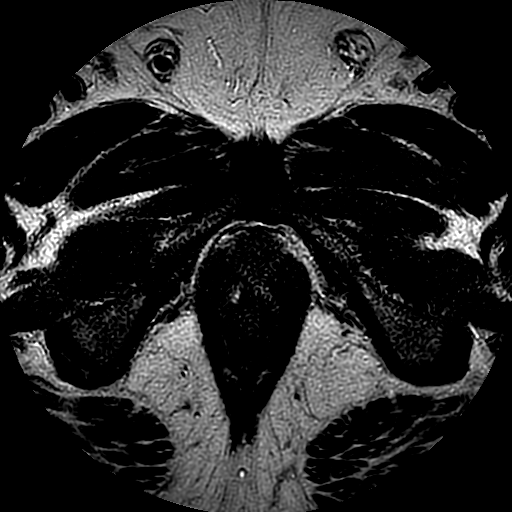

[Series 501: t2w cor · coronal · 3.0mm · 0.57mm/px · 2 of 35 slices shown]
[im 1/35]
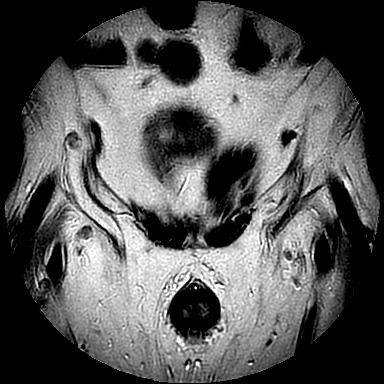
[im 35/35]
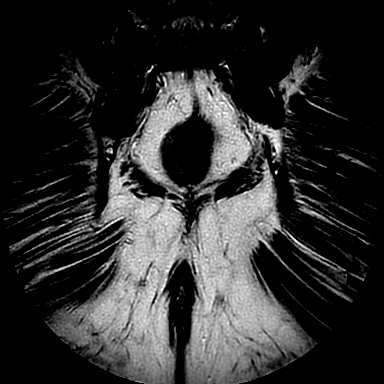

[Series 602: ADC · axial · 3.0mm · 0.76mm/px · 1 of 19 slices shown]
[im 1/19]
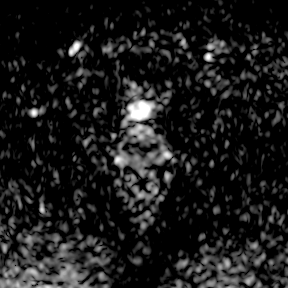

[Series 603: (id) · axial · 3.0mm · 0.76mm/px · 1 of 29 slices shown (1 of 2)]
[im 1/29]
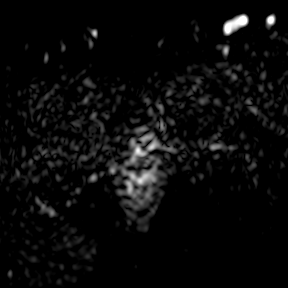

[Series 604: (id) · axial · 3.0mm · 0.76mm/px · 1 of 29 slices shown (2 of 2)]
[im 1/29]
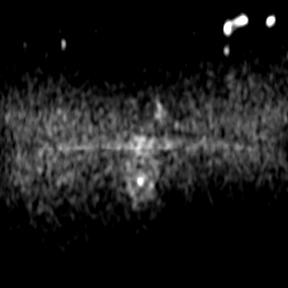

[Series 702: dadc 100 (id) · axial · 3.0mm · 0.98mm/px · 1 of 21 slices shown]
[im 1/21]
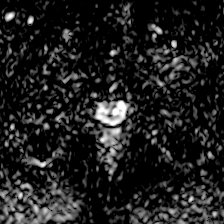

[Series 703: sb (id) · axial · 3.0mm · 0.98mm/px · 1 of 30 slices shown]
[im 1/30]
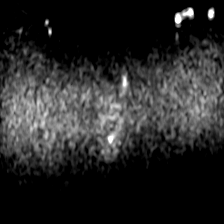

[Series 801: dyn 3mm* (ap) · axial · 3.0mm · 0.94mm/px · z∈[+19,+106]mm · 20 of 620 slices shown]
[im 1/620]
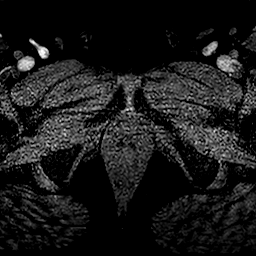
[im 21/620]
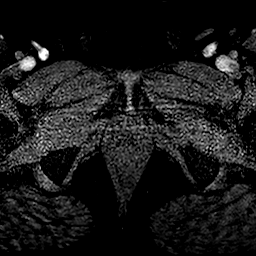
[im 42/620]
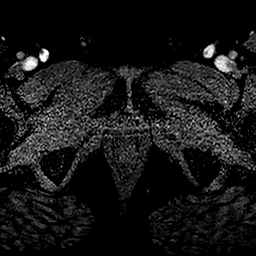
[im 62/620]
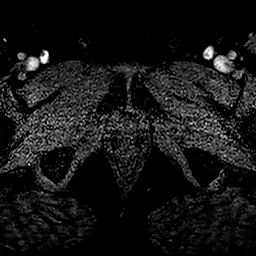
[im 83/620]
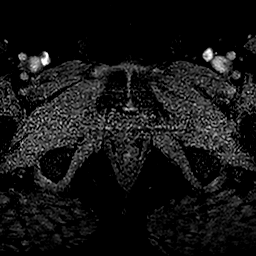
[im 104/620]
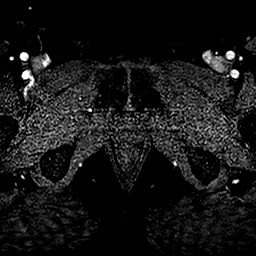
[im 124/620]
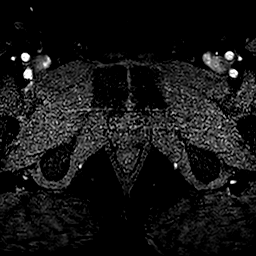
[im 145/620]
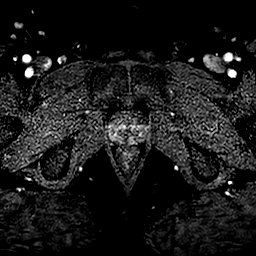
[im 166/620]
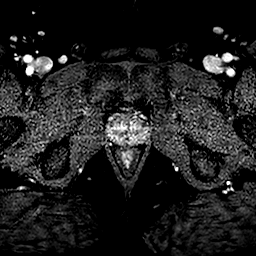
[im 186/620]
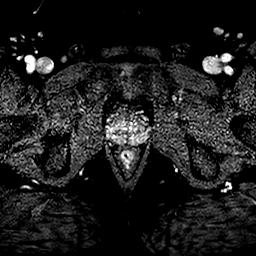
[im 207/620]
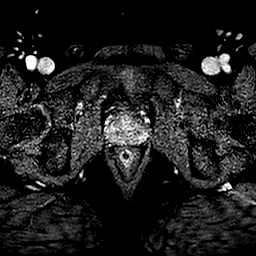
[im 227/620]
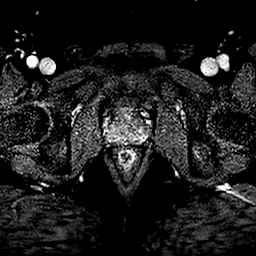
[im 248/620]
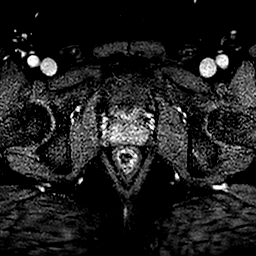
[im 269/620]
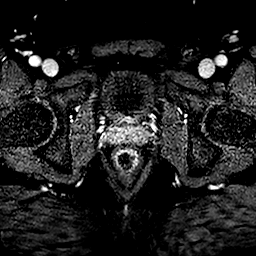
[im 289/620]
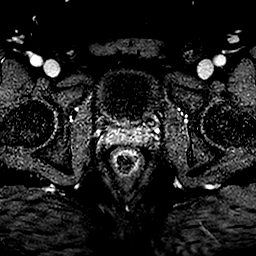
[im 310/620]
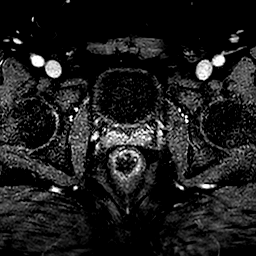
[im 351/620]
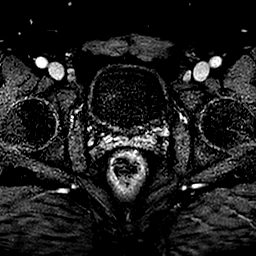
[im 434/620]
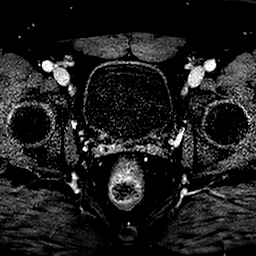
[im 516/620]
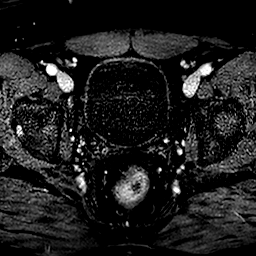
[im 599/620]
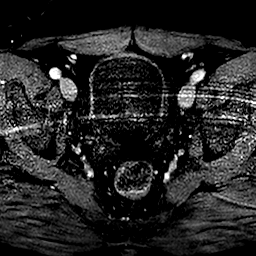

[Series 901: t1w/sp/pel +g · axial · 5.0mm · 0.80mm/px · z∈[-31,+233]mm · 4 of 90 slices shown]
[im 1/90]
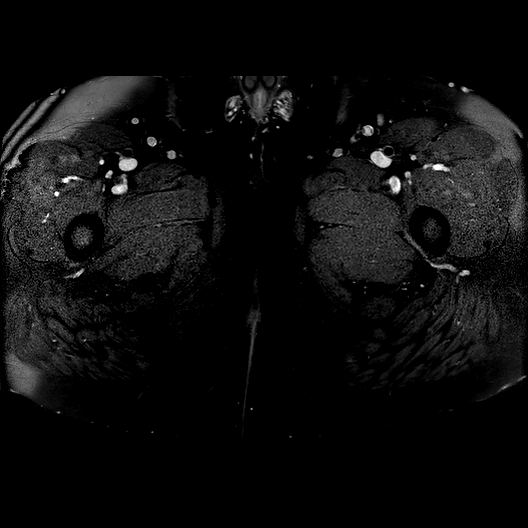
[im 30/90]
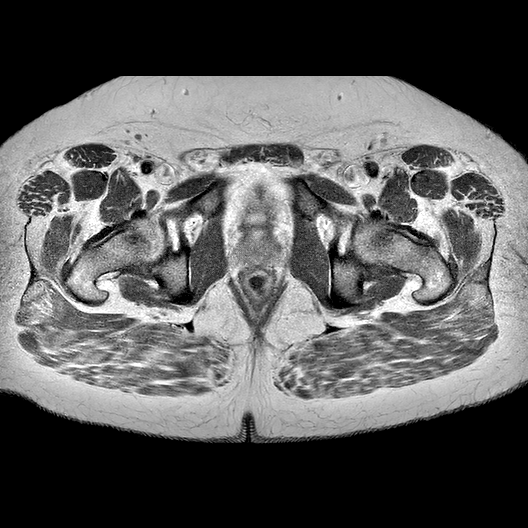
[im 60/90]
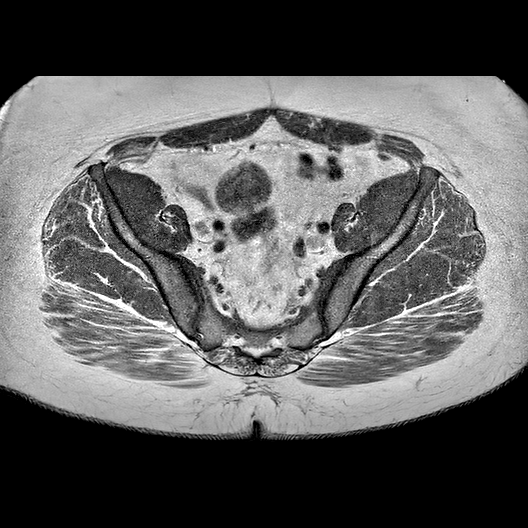
[im 90/90]
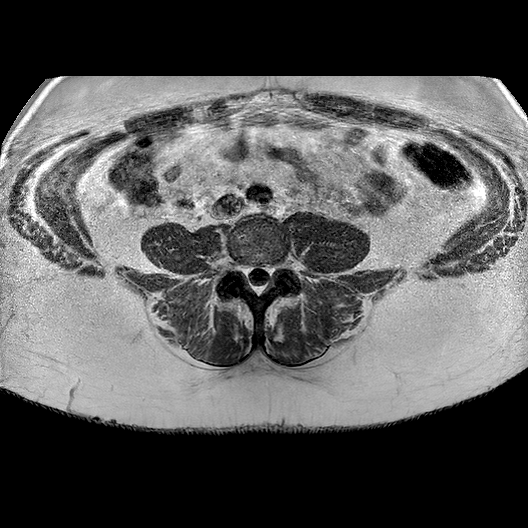

[37 of 48 positions shown; findings below may reference images not displayed]

FINDINGS: VOLUME: 23 cc 
CENTRAL GLAND: Nodular in appearance. 
PERIPHERAL ZONE: There are some areas that appear to show restricted diffusion 
though somewhat difficult to evaluate in this patient. This is seen within the 
left peripheral zone posterior laterally just above the mid gland level 
measuring upwards of 6 to 7 mm on ADC image 21. There is not a good T2 
correlate. No right-sided lesion identified. 
PROSTATE CAPSULE: Intact. 
MUSCLE SIDE WALLS: Normal in appearance. 
SEMINAL VESICLES: Normal in appearance. 
BLADDER: Normal in appearance. 
LYMPHADENOPATHY: No suspicious adenopathy identified. 
BONES: No osseous lesion identified. 
ADDITIONAL FINDINGS: Mild facet disease lower lumbar spine.
IMPRESSION: Subtle focus of diffusion abnormality left posterior lateral peripheral zone 
just above the mid gland level. Does not have good T2 correlate. Thought to be 
at least equivocal on this examination. 
(PI-RADS 3): Intermediate ( the presence of clinically significant cancer is 
equivocal).
